# Patient Record
Sex: Female | Born: 1960 | Race: White | Hispanic: No | Smoking: Never smoker
Health system: Southern US, Community
[De-identification: ages and names within clinical notes are randomized; demographics above are authoritative.]

## PROBLEM LIST (undated history)

## (undated) DIAGNOSIS — T7840XA Allergy, unspecified, initial encounter: Secondary | ICD-10-CM

## (undated) DIAGNOSIS — D219 Benign neoplasm of connective and other soft tissue, unspecified: Secondary | ICD-10-CM

## (undated) DIAGNOSIS — F329 Major depressive disorder, single episode, unspecified: Secondary | ICD-10-CM

## (undated) DIAGNOSIS — F32A Depression, unspecified: Secondary | ICD-10-CM

## (undated) DIAGNOSIS — K219 Gastro-esophageal reflux disease without esophagitis: Secondary | ICD-10-CM

## (undated) DIAGNOSIS — N809 Endometriosis, unspecified: Secondary | ICD-10-CM

## (undated) DIAGNOSIS — K635 Polyp of colon: Secondary | ICD-10-CM

## (undated) DIAGNOSIS — M1990 Unspecified osteoarthritis, unspecified site: Secondary | ICD-10-CM

## (undated) DIAGNOSIS — F419 Anxiety disorder, unspecified: Secondary | ICD-10-CM

## (undated) DIAGNOSIS — R3129 Other microscopic hematuria: Secondary | ICD-10-CM

## (undated) DIAGNOSIS — E079 Disorder of thyroid, unspecified: Secondary | ICD-10-CM

## (undated) HISTORY — DX: Unspecified osteoarthritis, unspecified site: M19.90

## (undated) HISTORY — DX: Major depressive disorder, single episode, unspecified: F32.9

## (undated) HISTORY — DX: Other microscopic hematuria: R31.29

## (undated) HISTORY — DX: Endometriosis, unspecified: N80.9

## (undated) HISTORY — DX: Benign neoplasm of connective and other soft tissue, unspecified: D21.9

## (undated) HISTORY — DX: Depression, unspecified: F32.A

## (undated) HISTORY — PX: BREAST SURGERY: SHX581

## (undated) HISTORY — PX: COLONOSCOPY: SHX174

## (undated) HISTORY — DX: Gastro-esophageal reflux disease without esophagitis: K21.9

## (undated) HISTORY — DX: Anxiety disorder, unspecified: F41.9

## (undated) HISTORY — DX: Polyp of colon: K63.5

## (undated) HISTORY — DX: Disorder of thyroid, unspecified: E07.9

## (undated) HISTORY — PX: APPENDECTOMY: SHX54

## (undated) HISTORY — DX: Allergy, unspecified, initial encounter: T78.40XA

---

## 1993-08-27 HISTORY — PX: OTHER SURGICAL HISTORY: SHX169

## 1994-08-27 HISTORY — PX: OOPHORECTOMY: SHX86

## 1994-08-27 HISTORY — PX: SALPINGECTOMY: SHX328

## 2015-03-18 ENCOUNTER — Encounter: Payer: Self-pay | Admitting: Internal Medicine

## 2015-05-24 ENCOUNTER — Encounter: Payer: Self-pay | Admitting: Internal Medicine

## 2015-09-29 ENCOUNTER — Encounter: Payer: Self-pay | Admitting: Internal Medicine

## 2015-09-29 ENCOUNTER — Ambulatory Visit (INDEPENDENT_AMBULATORY_CARE_PROVIDER_SITE_OTHER): Payer: PRIVATE HEALTH INSURANCE | Admitting: Internal Medicine

## 2015-09-29 VITALS — BP 130/84 | HR 83 | Temp 98.1°F | Resp 16 | Ht 64.5 in | Wt 169.0 lb

## 2015-09-29 DIAGNOSIS — H6993 Unspecified Eustachian tube disorder, bilateral: Secondary | ICD-10-CM | POA: Insufficient documentation

## 2015-09-29 DIAGNOSIS — J309 Allergic rhinitis, unspecified: Secondary | ICD-10-CM | POA: Insufficient documentation

## 2015-09-29 DIAGNOSIS — H6981 Other specified disorders of Eustachian tube, right ear: Secondary | ICD-10-CM

## 2015-09-29 DIAGNOSIS — F418 Other specified anxiety disorders: Secondary | ICD-10-CM | POA: Insufficient documentation

## 2015-09-29 DIAGNOSIS — J3089 Other allergic rhinitis: Secondary | ICD-10-CM | POA: Diagnosis not present

## 2015-09-29 DIAGNOSIS — R739 Hyperglycemia, unspecified: Secondary | ICD-10-CM

## 2015-09-29 DIAGNOSIS — E559 Vitamin D deficiency, unspecified: Secondary | ICD-10-CM | POA: Diagnosis not present

## 2015-09-29 DIAGNOSIS — H6983 Other specified disorders of Eustachian tube, bilateral: Secondary | ICD-10-CM | POA: Insufficient documentation

## 2015-09-29 DIAGNOSIS — J329 Chronic sinusitis, unspecified: Secondary | ICD-10-CM | POA: Insufficient documentation

## 2015-09-29 DIAGNOSIS — R7303 Prediabetes: Secondary | ICD-10-CM | POA: Insufficient documentation

## 2015-09-29 MED ORDER — ALPRAZOLAM 0.25 MG PO TABS: 0.2500 mg | ORAL_TABLET | Freq: Every evening | ORAL | Status: DC | PRN

## 2015-09-29 MED ORDER — FLUTICASONE PROPIONATE 50 MCG/ACT NA SUSP: 1.0000 | Freq: Every day | NASAL | Status: DC

## 2015-09-29 MED ORDER — NYSTATIN 100000 UNIT/GM EX CREA: 1.0000 "application " | TOPICAL_CREAM | Freq: Two times a day (BID) | CUTANEOUS | Status: DC

## 2015-09-29 NOTE — Patient Instructions (Addendum)
  We have reviewed your prior records including labs and tests today.  Test(s) ordered today. Your results will be released to [facility] (or called to you) after review, usually within 72hours after test completion. If any changes need to be made, you will be notified at that same time.  All other Health Maintenance issues reviewed.   All recommended immunizations and age-appropriate screenings are up-to-date.  No immunizations administered today.   Medications reviewed and updated.  No changes recommended at this time.  Your prescription(s) have been submitted to your pharmacy. Please take as directed and contact our office if you believe you are having problem(s) with the medication(s).  Follow up as needed

## 2015-09-29 NOTE — Progress Notes (Signed)
Subjective:    Patient ID: Amy Burton, female    DOB: Apr 25, 1961, 55 y.o.   MRN: [id number]  HPI She is here to establish with a new pcp.  She follows with a naturalopath and only takes supplements.  She will take Xanax only as needed for flying.  She needs blood work for her insurance.   Has been diagnosed with RA, fibromyalgia and by changing her lifestyle and has "cured it".  She eats an anti-inflammatory diet. She does experience increased joint pain or other symptoms.  She has not been exercising regularly, but plans on starting. She needs to lose weight and knows what she needs to do. She does eat a very healthy diet, she just needs to restart the exercise.  She does have chronic sinus issues and allergies. She only takes supplements for her medication weight medication if possible. She has used Flonase in the past and wonders about using that as needed.  Recently she has noticed a rash around her mouth. She did start using a different all-natural lip balm and is unsure if that is the cause.  She also tends to occur lips a lot and wonders about possibility of a yeast infection.  Overall she feels well and has no concerns besides those above.  Medications and allergies reviewed with patient and updated if appropriate.  There are no active problems to display for this patient.   No current outpatient prescriptions on file prior to visit.   No current facility-administered medications on file prior to visit.    Past Medical History  Diagnosis Date  . Depression   . Thyroid disease   . Colon polyp     Past Surgical History  Procedure Laterality Date  . Cesarean section    . Breast surgery    . Endomitriosis  1995    Social History   Social History  . Marital Status: Married    Spouse Name: N/A  . Number of Children: N/A  . Years of Education: N/A   Social History Main Topics  . Smoking status: Never Smoker   . Smokeless tobacco: Never Used  . Alcohol  Use: Yes  . Drug Use: No  . Sexual Activity: Not Asked   Other Topics Concern  . None   Social History Narrative  . None    Family History  Problem Relation Age of Onset  . Hyperlipidemia Mother   . Hypertension Mother   . Diabetes Father   . Arthritis Sister   . Heart disease Brother   . Cancer Maternal Grandmother     breast    Review of Systems  Constitutional: Negative for fever and chills.  HENT: Positive for postnasal drip.   Respiratory: Negative for cough, shortness of breath and wheezing.   Cardiovascular: Positive for palpitations (occasional). Negative for chest pain and leg swelling.  Gastrointestinal: Positive for constipation. Negative for nausea and abdominal pain.       No GERD  Genitourinary: Negative for dysuria and hematuria.  Musculoskeletal: Positive for arthralgias (hip pain).  Skin: Positive for rash.  Neurological: Positive for dizziness (ear disease, sinus issures, occasional). Negative for light-headedness and headaches.       Objective:   Filed Vitals:   09/29/15 0852  BP: 130/84  Pulse: 83  Temp: 98.1 F (36.7 C)  Resp: 16   Filed Weights   09/29/15 0852  Weight: 169 lb (76.658 kg)   Body mass index is 28.57 kg/(m^2).   Physical Exam Constitutional: She  appears well-developed and well-nourished. No distress.  HENT:  Head: Normocephalic and atraumatic.  Right Ear: External ear normal. Normal ear canal and TM Left Ear: External ear normal.  Normal ear canal and TM Mouth/Throat: Oropharynx is clear and moist.  Normal bilateral ear canals and tympanic membranes  Eyes: Conjunctivae and EOM are normal.  Neck: Neck supple. No tracheal deviation present. No thyromegaly present.  No carotid bruit  Cardiovascular: Normal rate, regular rhythm and normal heart sounds.   No murmur heard.  No edema. Pulmonary/Chest: Effort normal and breath sounds normal. No respiratory distress. She has no wheezes. She has no rales.  Abdominal: Soft.  She exhibits no distension. There is no tenderness.  Lymphadenopathy: She has no cervical adenopathy.  Skin: Skin is warm and dry. She is not diaphoretic. mild redness, dryness lips, corners of mouth Psychiatric: She has a normal mood and affect. Her behavior is normal.       Assessment & Plan:   Perioral rash Possibly reaction to her new lip balm or yeast infection Nystatin cream prescribed Try switching to a different product Avoid licking lips Do not think she has any vitamin deficiency because she is on multiple vitamins  Routine screening blood work ordered-it has been a while and she really needs it for insurance purposes  Chronic sinus inflammation, allergic allergies and right-sided eustachian tube dysfunction She does see an ENT Can use Flonase as needed  Anxiety with flying Xanax as needed  Mammogram-she likely will have-it has been about 6 years. She typically gets tomography annually  Follow-up as needed

## 2015-09-29 NOTE — Progress Notes (Signed)
Pre visit review using our clinic review tool, if applicable. No additional management support is needed unless otherwise documented below in the visit note. 

## 2015-10-03 ENCOUNTER — Other Ambulatory Visit (INDEPENDENT_AMBULATORY_CARE_PROVIDER_SITE_OTHER): Payer: PRIVATE HEALTH INSURANCE

## 2015-10-03 DIAGNOSIS — E559 Vitamin D deficiency, unspecified: Secondary | ICD-10-CM | POA: Diagnosis not present

## 2015-10-03 DIAGNOSIS — R739 Hyperglycemia, unspecified: Secondary | ICD-10-CM | POA: Diagnosis not present

## 2015-10-03 LAB — LIPID PANEL
CHOL/HDL RATIO: 3
CHOLESTEROL: 211 mg/dL — AB (ref 0–200)
HDL: 66.4 mg/dL (ref >39.00)
LDL Cholesterol: 130 mg/dL — ABNORMAL HIGH (ref 0–99)
NonHDL: 144.84
TRIGLYCERIDES: 76 mg/dL (ref 0.0–149.0)
VLDL: 15.2 mg/dL (ref 0.0–40.0)

## 2015-10-03 LAB — COMPREHENSIVE METABOLIC PANEL
ALBUMIN: 4.6 g/dL (ref 3.5–5.2)
ALK PHOS: 83 U/L (ref 39–117)
ALT: 13 U/L (ref 0–35)
AST: 16 U/L (ref 0–37)
BUN: 18 mg/dL (ref 6–23)
CALCIUM: 10.3 mg/dL (ref 8.4–10.5)
CHLORIDE: 106 meq/L (ref 96–112)
CO2: 26 mEq/L (ref 19–32)
Creatinine, Ser: 0.73 mg/dL (ref 0.40–1.20)
GFR: 87.71 mL/min (ref >60.00)
Glucose, Bld: 97 mg/dL (ref 70–99)
POTASSIUM: 4.1 meq/L (ref 3.5–5.1)
SODIUM: 140 meq/L (ref 135–145)
TOTAL PROTEIN: 7.6 g/dL (ref 6.0–8.3)
Total Bilirubin: 0.5 mg/dL (ref 0.2–1.2)

## 2015-10-03 LAB — CBC WITH DIFFERENTIAL/PLATELET
BASOS PCT: 0.4 % (ref 0.0–3.0)
Basophils Absolute: 0 10*3/uL (ref 0.0–0.1)
EOS ABS: 0.2 10*3/uL (ref 0.0–0.7)
EOS PCT: 2.7 % (ref 0.0–5.0)
HEMATOCRIT: 40.5 % (ref 36.0–46.0)
HEMOGLOBIN: 13.5 g/dL (ref 12.0–15.0)
LYMPHS PCT: 19.2 % (ref 12.0–46.0)
Lymphs Abs: 1.1 10*3/uL (ref 0.7–4.0)
MCHC: 33.3 g/dL (ref 30.0–36.0)
MCV: 84.9 fl (ref 78.0–100.0)
Monocytes Absolute: 0.5 10*3/uL (ref 0.1–1.0)
Monocytes Relative: 7.7 % (ref 3.0–12.0)
Neutro Abs: 4.1 10*3/uL (ref 1.4–7.7)
Neutrophils Relative %: 70 % (ref 43.0–77.0)
Platelets: 266 10*3/uL (ref 150.0–400.0)
RBC: 4.77 Mil/uL (ref 3.87–5.11)
RDW: 13.5 % (ref 11.5–15.5)
WBC: 5.9 10*3/uL (ref 4.0–10.5)

## 2015-10-03 LAB — T3, FREE: T3, Free: 3.3 pg/mL (ref 2.3–4.2)

## 2015-10-03 LAB — T4, FREE: Free T4: 0.72 ng/dL (ref 0.60–1.60)

## 2015-10-03 LAB — TSH: TSH: 2.78 u[IU]/mL (ref 0.35–4.50)

## 2015-10-03 LAB — HEMOGLOBIN A1C: Hgb A1c MFr Bld: 5.9 % (ref 4.6–6.5)

## 2015-10-06 ENCOUNTER — Encounter: Payer: Self-pay | Admitting: Internal Medicine

## 2015-10-06 ENCOUNTER — Telehealth: Payer: Self-pay | Admitting: Internal Medicine

## 2015-10-06 LAB — VITAMIN D 1,25 DIHYDROXY
VITAMIN D 1, 25 (OH) TOTAL: 90 pg/mL — AB (ref 18–72)
Vitamin D2 1, 25 (OH)2: <8 pg/mL
Vitamin D3 1, 25 (OH)2: 90 pg/mL

## 2015-10-06 NOTE — Telephone Encounter (Signed)
Spoke with pt to inform.  

## 2015-10-06 NOTE — Telephone Encounter (Signed)
Pt is requesting lab results from Monday

## 2015-10-06 NOTE — Telephone Encounter (Signed)
Done - released

## 2015-10-06 NOTE — Telephone Encounter (Signed)
Please release labs on MyChart.

## 2015-10-10 ENCOUNTER — Telehealth: Payer: Self-pay | Admitting: Internal Medicine

## 2015-10-10 DIAGNOSIS — Z7689 Persons encountering health services in other specified circumstances: Secondary | ICD-10-CM

## 2015-10-10 NOTE — Telephone Encounter (Signed)
Received email with provider attestation form for CPE. Since taylor and dr burns are both out, filled it out and faxed it in for the patient. Notified, sending to charge and scan

## 2015-10-13 ENCOUNTER — Telehealth: Payer: Self-pay | Admitting: Emergency Medicine

## 2015-10-13 NOTE — Telephone Encounter (Signed)
Pts wellness paperwork has been faxed to the number provided by pt.

## 2015-11-15 ENCOUNTER — Telehealth: Payer: Self-pay | Admitting: Internal Medicine

## 2015-11-15 NOTE — Telephone Encounter (Signed)
What is she taking?  Flonase? Sudafed?

## 2015-11-15 NOTE — Telephone Encounter (Signed)
Patient states she was in last month and Dr. Quay Burow told patient she had fluid on her ear.  Patient states it has gradually gotten worse and now the ear is starting to hurt and keeping her up through the night.  Our offices is full as well as other locations close to her.  She would like to know if Dr. Quay Burow would be able to send something into her pharmacy at [facility] in [place].

## 2015-11-16 NOTE — Telephone Encounter (Signed)
Pt stated that she has been using Flonase and feels that it has made the symptoms decrease. Pt was advised to call back if symptoms failed to improve.

## 2016-01-03 ENCOUNTER — Telehealth: Payer: Self-pay | Admitting: Internal Medicine

## 2016-01-03 ENCOUNTER — Encounter: Payer: Self-pay | Admitting: Internal Medicine

## 2016-01-03 NOTE — Telephone Encounter (Signed)
If it is regarding gyn issues she should see a new gyn for this.  Can try   [facility] - dr Zoe Lan [facility] [address] [place], [place] [address] from [place]  [place] - dr morris [address]  [place], [place]

## 2016-01-03 NOTE — Telephone Encounter (Signed)
Pt is also complaining of hip and lower back pain. She states she is not having lower abdominal pain at the moment. She is not sure if it ortho issues or "female issues" Informed her of the GYN's listed if she would like to get a second opinion. Please advise if she should also go to ortho or see Dr Tamala Julian.

## 2016-01-03 NOTE — Telephone Encounter (Signed)
Please advise if you think pt should try seeing a new GYN.

## 2016-01-03 NOTE — Telephone Encounter (Signed)
Pt states she is having some concerns regarding OBGYN issues. She is thinking everything is kinda falling down and she feels her current Gyn at [facility] is not taking her seriously.  She is wondering if you want to see her about this or if you want to just put a referral in.  She will be unavailable between 2-3 Please advise

## 2016-01-03 NOTE — Telephone Encounter (Signed)
Have her see dr Tamala Julian

## 2016-01-04 NOTE — Telephone Encounter (Signed)
Spoke with pt & Dr Tamala Julian, Pt is scheduled with Dr Tamala Julian on 5/25

## 2016-01-19 ENCOUNTER — Ambulatory Visit: Payer: PRIVATE HEALTH INSURANCE | Admitting: Family Medicine

## 2016-09-06 NOTE — Patient Instructions (Addendum)
Dermatology - Dr Amy Martinique.  [facility].    Test(s) ordered today. Your results will be released to [facility] (or called to you) after review, usually within 72hours after test completion. If any changes need to be made, you will be notified at that same time.  All other Health Maintenance issues reviewed.   All recommended immunizations and age-appropriate screenings are up-to-date or discussed.  No immunizations administered today.   Medications reviewed and updated.  No changes recommended at this time.    Please followup in one year for a physical   Health Maintenance, Female Introduction Adopting a healthy lifestyle and getting preventive care can go a long way to promote health and wellness. Talk with your health care provider about what schedule of regular examinations is right for you. This is a good chance for you to check in with your provider about disease prevention and staying healthy. In between checkups, there are plenty of things you can do on your own. Experts have done a lot of research about which lifestyle changes and preventive measures are most likely to keep you healthy. Ask your health care provider for more information. Weight and diet Eat a healthy diet  Be sure to include plenty of vegetables, fruits, low-fat dairy products, and lean protein.  Do not eat a lot of foods high in solid fats, added sugars, or salt.  Get regular exercise. This is one of the most important things you can do for your health.  Most adults should exercise for at least 150 minutes each week. The exercise should increase your heart rate and make you sweat (moderate-intensity exercise).  Most adults should also do strengthening exercises at least twice a week. This is in addition to the moderate-intensity exercise. Maintain a healthy weight  Body mass index (BMI) is a measurement that can be used to identify possible weight problems. It estimates body fat based on height  and weight. Your health care provider can help determine your BMI and help you achieve or maintain a healthy weight.  For females 15 years of age and older:  A BMI below 18.5 is considered underweight.  A BMI of 18.5 to 24.9 is normal.  A BMI of 25 to 29.9 is considered overweight.  A BMI of 30 and above is considered obese. Watch levels of cholesterol and blood lipids  You should start having your blood tested for lipids and cholesterol at 57 years of age, then have this test every 5 years.  You may need to have your cholesterol levels checked more often if:  Your lipid or cholesterol levels are high.  You are older than 57 years of age.  You are at high risk for heart disease. Cancer screening Lung Cancer  Lung cancer screening is recommended for adults 58-44 years old who are at high risk for lung cancer because of a history of smoking.  A yearly low-dose CT scan of the lungs is recommended for people who:  Currently smoke.  Have quit within the past 15 years.  Have at least a 30-pack-year history of smoking. A pack year is smoking an average of one pack of cigarettes a day for 1 year.  Yearly screening should continue until it has been 15 years since you quit.  Yearly screening should stop if you develop a health problem that would prevent you from having lung cancer treatment. Breast Cancer  Practice breast self-awareness. This means understanding how your breasts normally appear and feel.  It also means doing  regular breast self-exams. Let your health care provider know about any changes, no matter how small.  If you are in your 20s or 30s, you should have a clinical breast exam (CBE) by a health care provider every 1-3 years as part of a regular health exam.  If you are 52 or older, have a CBE every year. Also consider having a breast X-ray (mammogram) every year.  If you have a family history of breast cancer, talk to your health care provider about genetic  screening.  If you are at high risk for breast cancer, talk to your health care provider about having an MRI and a mammogram every year.  Breast cancer gene (BRCA) assessment is recommended for women who have family members with BRCA-related cancers. BRCA-related cancers include:  Breast.  Ovarian.  Tubal.  Peritoneal cancers.  Results of the assessment will determine the need for genetic counseling and BRCA1 and BRCA2 testing. Cervical Cancer  Your health care provider may recommend that you be screened regularly for cancer of the pelvic organs (ovaries, uterus, and vagina). This screening involves a pelvic examination, including checking for microscopic changes to the surface of your cervix (Pap test). You may be encouraged to have this screening done every 3 years, beginning at age 27.  For women ages 80-65, health care providers may recommend pelvic exams and Pap testing every 3 years, or they may recommend the Pap and pelvic exam, combined with testing for human papilloma virus (HPV), every 5 years. Some types of HPV increase your risk of cervical cancer. Testing for HPV may also be done on women of any age with unclear Pap test results.  Other health care providers may not recommend any screening for nonpregnant women who are considered low risk for pelvic cancer and who do not have symptoms. Ask your health care provider if a screening pelvic exam is right for you.  If you have had past treatment for cervical cancer or a condition that could lead to cancer, you need Pap tests and screening for cancer for at least 20 years after your treatment. If Pap tests have been discontinued, your risk factors (such as having a new sexual partner) need to be reassessed to determine if screening should resume. Some women have medical problems that increase the chance of getting cervical cancer. In these cases, your health care provider may recommend more frequent screening and Pap tests. Colorectal  Cancer  This type of cancer can be detected and often prevented.  Routine colorectal cancer screening usually begins at 57 years of age and continues through 57 years of age.  Your health care provider may recommend screening at an earlier age if you have risk factors for colon cancer.  Your health care provider may also recommend using home test kits to check for hidden blood in the stool.  A small camera at the end of a tube can be used to examine your colon directly (sigmoidoscopy or colonoscopy). This is done to check for the earliest forms of colorectal cancer.  Routine screening usually begins at age 67.  Direct examination of the colon should be repeated every 5-10 years through 57 years of age. However, you may need to be screened more often if early forms of precancerous polyps or small growths are found. Skin Cancer  Check your skin from head to toe regularly.  Tell your health care provider about any new moles or changes in moles, especially if there is a change in a mole's shape or  color.  Also tell your health care provider if you have a mole that is larger than the size of a pencil eraser.  Always use sunscreen. Apply sunscreen liberally and repeatedly throughout the day.  Protect yourself by wearing long sleeves, pants, a wide-brimmed hat, and sunglasses whenever you are outside. Heart disease, diabetes, and high blood pressure  High blood pressure causes heart disease and increases the risk of stroke. High blood pressure is more likely to develop in:  People who have blood pressure in the high end of the normal range (130-139/85-89 mm Hg).  People who are overweight or obese.  People who are African American.  If you are 55-48 years of age, have your blood pressure checked every 3-5 years. If you are 25 years of age or older, have your blood pressure checked every year. You should have your blood pressure measured twice-once when you are at a hospital or clinic,  and once when you are not at a hospital or clinic. Record the average of the two measurements. To check your blood pressure when you are not at a hospital or clinic, you can use:  An automated blood pressure machine at a pharmacy.  A home blood pressure monitor.  If you are between 28 years and 67 years old, ask your health care provider if you should take aspirin to prevent strokes.  Have regular diabetes screenings. This involves taking a blood sample to check your fasting blood sugar level.  If you are at a normal weight and have a low risk for diabetes, have this test once every three years after 57 years of age.  If you are overweight and have a high risk for diabetes, consider being tested at a younger age or more often. Preventing infection Hepatitis B  If you have a higher risk for hepatitis B, you should be screened for this virus. You are considered at high risk for hepatitis B if:  You were born in a country where hepatitis B is common. Ask your health care provider which countries are considered high risk.  Your parents were born in a high-risk country, and you have not been immunized against hepatitis B (hepatitis B vaccine).  You have HIV or AIDS.  You use needles to inject street drugs.  You live with someone who has hepatitis B.  You have had sex with someone who has hepatitis B.  You get hemodialysis treatment.  You take certain medicines for conditions, including cancer, organ transplantation, and autoimmune conditions. Hepatitis C  Blood testing is recommended for:  Everyone born from [birth year] through 1966.  Anyone with known risk factors for hepatitis C. Sexually transmitted infections (STIs)  You should be screened for sexually transmitted infections (STIs) including gonorrhea and chlamydia if:  You are sexually active and are younger than 57 years of age.  You are older than 57 years of age and your health care provider tells you that you are at risk  for this type of infection.  Your sexual activity has changed since you were last screened and you are at an increased risk for chlamydia or gonorrhea. Ask your health care provider if you are at risk.  If you do not have HIV, but are at risk, it may be recommended that you take a prescription medicine daily to prevent HIV infection. This is called pre-exposure prophylaxis (PrEP). You are considered at risk if:  You are sexually active and do not regularly use condoms or know the HIV status of your  partner(s).  You take drugs by injection.  You are sexually active with a partner who has HIV. Talk with your health care provider about whether you are at high risk of being infected with HIV. If you choose to begin PrEP, you should first be tested for HIV. You should then be tested every 3 months for as long as you are taking PrEP. Pregnancy  If you are premenopausal and you may become pregnant, ask your health care provider about preconception counseling.  If you may become pregnant, take 400 to 800 micrograms (mcg) of folic acid every day.  If you want to prevent pregnancy, talk to your health care provider about birth control (contraception). Osteoporosis and menopause  Osteoporosis is a disease in which the bones lose minerals and strength with aging. This can result in serious bone fractures. Your risk for osteoporosis can be identified using a bone density scan.  If you are 60 years of age or older, or if you are at risk for osteoporosis and fractures, ask your health care provider if you should be screened.  Ask your health care provider whether you should take a calcium or vitamin D supplement to lower your risk for osteoporosis.  Menopause may have certain physical symptoms and risks.  Hormone replacement therapy may reduce some of these symptoms and risks. Talk to your health care provider about whether hormone replacement therapy is right for you. Follow these instructions at  home:  Schedule regular health, dental, and eye exams.  Stay current with your immunizations.  Do not use any tobacco products including cigarettes, chewing tobacco, or electronic cigarettes.  If you are pregnant, do not drink alcohol.  If you are breastfeeding, limit how much and how often you drink alcohol.  Limit alcohol intake to no more than 1 drink per day for nonpregnant women. One drink equals 12 ounces of beer, 5 ounces of wine, or 1 ounces of hard liquor.  Do not use street drugs.  Do not share needles.  Ask your health care provider for help if you need support or information about quitting drugs.  Tell your health care provider if you often feel depressed.  Tell your health care provider if you have ever been abused or do not feel safe at home. This information is not intended to replace advice given to you by your health care provider. Make sure you discuss any questions you have with your health care provider. Document Released: 02/26/2011 Document Revised: 01/19/2016 Document Reviewed: 05/17/2015  2017 [employer]

## 2016-09-06 NOTE — Progress Notes (Signed)
NSR   Subjective:    Patient ID: Amy Burton, female    DOB: [birth year], 56 y.o.   MRN: [id number]  HPI She is here for a physical exam.   She has had increased stress over the past few months.  It is starting to improve.  Her sleep is getting better.  She does follow with a natural doctor - he thinks some of this is hormonal.  He will be doing a estrogen test.    Medications and allergies reviewed with patient and updated if appropriate.  Patient Active Problem List   Diagnosis Date Noted  . Chronic sinusitis 09/29/2015  . Allergic rhinitis 09/29/2015  . Hyperglycemia 09/29/2015  . Vitamin D deficiency 09/29/2015  . Situational anxiety-flying 09/29/2015  . Eustachian tube dysfunction, right 09/29/2015    Current Outpatient Prescriptions on File Prior to Visit  Medication Sig Dispense Refill  . ALPRAZolam (XANAX) 0.25 MG tablet Take 1 tablet (0.25 mg total) by mouth at bedtime as needed for anxiety (For Dental work and traveling). 30 tablet 0  . ibuprofen (ADVIL,MOTRIN) 600 MG tablet Take 600 mg by mouth every 6 (six) hours as needed.    . Probiotic Product (PROBIOTIC PO) Take by mouth.     No current facility-administered medications on file prior to visit.     Past Medical History:  Diagnosis Date  . Colon polyp   . Depression   . Thyroid disease     Past Surgical History:  Procedure Laterality Date  . BREAST SURGERY    . CESAREAN SECTION    . endomitriosis  1995    Social History   Social History  . Marital status: Married    Spouse name: N/A  . Number of children: N/A  . Years of education: N/A   Social History Main Topics  . Smoking status: Never Smoker  . Smokeless tobacco: Never Used  . Alcohol use Yes  . Drug use: No  . Sexual activity: Not on file   Other Topics Concern  . Not on file   Social History Narrative  . No narrative on file    Family History  Problem Relation Age of Onset  . Hyperlipidemia Mother   . Hypertension Mother    . Diabetes Father   . Arthritis Sister   . Heart disease Brother   . Cancer Maternal Grandmother     breast    Review of Systems  Constitutional: Positive for fever (low grade fevers occaisonally). Negative for chills.  Eyes: Negative for visual disturbance.  Respiratory: Negative for cough, shortness of breath and wheezing.   Cardiovascular: Positive for palpitations (anxiety ). Negative for chest pain and leg swelling.  Gastrointestinal: Positive for abdominal pain (occ LLQ with constipation), anal bleeding (hemorrhoidal) and constipation. Negative for blood in stool, diarrhea and nausea.       Jerrye Bushy - treated with enzymes  Genitourinary: Positive for urgency. Negative for dysuria and hematuria.  Musculoskeletal: Positive for arthralgias (hip).  Skin: Positive for rash (eczema).  Neurological: Negative for light-headedness and headaches.  Psychiatric/Behavioral: Positive for dysphoric mood (mild). The patient is nervous/anxious.        Objective:   Vitals:   09/07/16 0758  BP: 124/80  Pulse: 74  Resp: 16  Temp: 98.5 F (36.9 C)   Filed Weights   09/07/16 0758  Weight: 175 lb (79.4 kg)   Body mass index is 29.12 kg/m.  Wt Readings from Last 3 Encounters:  09/07/16 175 lb (79.4 kg)  09/29/15 169  lb (76.7 kg)     Physical Exam Constitutional: She appears well-developed and well-nourished. No distress.  HENT:  Head: Normocephalic and atraumatic.  Right Ear: External ear normal. Normal ear canal and TM Left Ear: External ear normal.  Normal ear canal and TM Mouth/Throat: Oropharynx is clear and moist.  Eyes: Conjunctivae and EOM are normal.  Neck: Neck supple. No tracheal deviation present. No thyromegaly present.  No carotid bruit  Cardiovascular: Normal rate, regular rhythm and normal heart sounds.   No murmur heard.  No edema. Pulmonary/Chest: Effort normal and breath sounds normal. No respiratory distress. She has no wheezes. She has no rales.  Breast:  deferred to Gyn Abdominal: Soft. She exhibits no distension. There is no tenderness.  Lymphadenopathy: She has no cervical adenopathy.  Skin: Skin is warm and dry. She is not diaphoretic.  Psychiatric: She has a normal mood and affect. Her behavior is normal.         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

## 2016-09-07 ENCOUNTER — Other Ambulatory Visit (INDEPENDENT_AMBULATORY_CARE_PROVIDER_SITE_OTHER): Payer: PRIVATE HEALTH INSURANCE

## 2016-09-07 ENCOUNTER — Ambulatory Visit (INDEPENDENT_AMBULATORY_CARE_PROVIDER_SITE_OTHER): Payer: PRIVATE HEALTH INSURANCE | Admitting: Internal Medicine

## 2016-09-07 ENCOUNTER — Encounter: Payer: Self-pay | Admitting: Internal Medicine

## 2016-09-07 VITALS — BP 124/80 | HR 74 | Temp 98.5°F | Resp 16 | Ht 65.0 in | Wt 175.0 lb

## 2016-09-07 DIAGNOSIS — Z0000 Encounter for general adult medical examination without abnormal findings: Secondary | ICD-10-CM

## 2016-09-07 DIAGNOSIS — H1851 Endothelial corneal dystrophy: Secondary | ICD-10-CM

## 2016-09-07 DIAGNOSIS — R3915 Urgency of urination: Secondary | ICD-10-CM

## 2016-09-07 DIAGNOSIS — L309 Dermatitis, unspecified: Secondary | ICD-10-CM | POA: Insufficient documentation

## 2016-09-07 DIAGNOSIS — R739 Hyperglycemia, unspecified: Secondary | ICD-10-CM | POA: Diagnosis not present

## 2016-09-07 DIAGNOSIS — F418 Other specified anxiety disorders: Secondary | ICD-10-CM

## 2016-09-07 DIAGNOSIS — H18519 Endothelial corneal dystrophy, unspecified eye: Secondary | ICD-10-CM | POA: Insufficient documentation

## 2016-09-07 LAB — CBC WITH DIFFERENTIAL/PLATELET
BASOS PCT: 0.5 % (ref 0.0–3.0)
Basophils Absolute: 0 10*3/uL (ref 0.0–0.1)
EOS PCT: 3.1 % (ref 0.0–5.0)
Eosinophils Absolute: 0.2 10*3/uL (ref 0.0–0.7)
HCT: 38.5 % (ref 36.0–46.0)
Hemoglobin: 13.3 g/dL (ref 12.0–15.0)
Lymphocytes Relative: 20.2 % (ref 12.0–46.0)
Lymphs Abs: 1 10*3/uL (ref 0.7–4.0)
MCHC: 34.5 g/dL (ref 30.0–36.0)
MCV: 83.1 fl (ref 78.0–100.0)
MONOS PCT: 8.5 % (ref 3.0–12.0)
Monocytes Absolute: 0.4 10*3/uL (ref 0.1–1.0)
NEUTROS ABS: 3.3 10*3/uL (ref 1.4–7.7)
NEUTROS PCT: 67.7 % (ref 43.0–77.0)
PLATELETS: 229 10*3/uL (ref 150.0–400.0)
RBC: 4.64 Mil/uL (ref 3.87–5.11)
RDW: 13.9 % (ref 11.5–15.5)
WBC: 4.9 10*3/uL (ref 4.0–10.5)

## 2016-09-07 LAB — HEMOGLOBIN A1C: Hgb A1c MFr Bld: 5.8 % (ref 4.6–6.5)

## 2016-09-07 LAB — TSH: TSH: 1.95 u[IU]/mL (ref 0.35–4.50)

## 2016-09-07 LAB — LIPID PANEL
CHOL/HDL RATIO: 3
CHOLESTEROL: 204 mg/dL — AB (ref 0–200)
HDL: 60.5 mg/dL (ref >39.00)
LDL CALC: 113 mg/dL — AB (ref 0–99)
NonHDL: 143.06
TRIGLYCERIDES: 148 mg/dL (ref 0.0–149.0)
VLDL: 29.6 mg/dL (ref 0.0–40.0)

## 2016-09-07 LAB — COMPREHENSIVE METABOLIC PANEL
ALT: 13 U/L (ref 0–35)
AST: 16 U/L (ref 0–37)
Albumin: 4.5 g/dL (ref 3.5–5.2)
Alkaline Phosphatase: 65 U/L (ref 39–117)
BUN: 15 mg/dL (ref 6–23)
CALCIUM: 10.1 mg/dL (ref 8.4–10.5)
CO2: 27 meq/L (ref 19–32)
Chloride: 107 mEq/L (ref 96–112)
Creatinine, Ser: 0.65 mg/dL (ref 0.40–1.20)
GFR: 99.94 mL/min (ref >60.00)
GLUCOSE: 98 mg/dL (ref 70–99)
POTASSIUM: 3.8 meq/L (ref 3.5–5.1)
Sodium: 141 mEq/L (ref 135–145)
Total Bilirubin: 0.7 mg/dL (ref 0.2–1.2)
Total Protein: 7.1 g/dL (ref 6.0–8.3)

## 2016-09-07 LAB — URINALYSIS, ROUTINE W REFLEX MICROSCOPIC
BILIRUBIN URINE: NEGATIVE
KETONES UR: NEGATIVE
LEUKOCYTES UA: NEGATIVE
Nitrite: NEGATIVE
PH: 6 (ref 5.0–8.0)
RBC / HPF: NONE SEEN (ref 0–?)
Specific Gravity, Urine: <=1.005 — AB (ref 1.000–1.030)
TOTAL PROTEIN, URINE-UPE24: NEGATIVE
URINE GLUCOSE: NEGATIVE
UROBILINOGEN UA: 0.2 (ref 0.0–1.0)

## 2016-09-07 MED ORDER — CETIRIZINE HCL 10 MG PO TABS: 10.0000 mg | ORAL_TABLET | Freq: Every day | ORAL | 11 refills | Status: AC

## 2016-09-07 MED ORDER — ALPRAZOLAM 0.25 MG PO TABS: 0.2500 mg | ORAL_TABLET | Freq: Every day | ORAL | 0 refills | Status: DC | PRN

## 2016-09-07 NOTE — Progress Notes (Signed)
Pre visit review using our clinic review tool, if applicable. No additional management support is needed unless otherwise documented below in the visit note. 

## 2016-09-07 NOTE — Assessment & Plan Note (Signed)
Check a1c Low sugar/carb diet Encouraged regula exercise

## 2016-09-07 NOTE — Assessment & Plan Note (Signed)
Check UA, UCx to rule out infection 

## 2016-09-07 NOTE — Assessment & Plan Note (Signed)
Uses xanax only as needed - flying and dental work Refilled today

## 2016-09-08 ENCOUNTER — Encounter: Payer: Self-pay | Admitting: Internal Medicine

## 2016-09-08 NOTE — Progress Notes (Unsigned)
Amy Burton,  Your blood counts, thyroid function, kidney function and liver tests are normal.  Your cholesterol is good.  Your sugars are still in the prediabetic range, but slightly better than one year ago.  Your urine is normal without infection.    Let me know if you have any questions or concerns.   Dr. Billey Gosling.

## 2016-09-09 LAB — URINE CULTURE: ORGANISM ID, BACTERIA: NO GROWTH

## 2016-09-26 ENCOUNTER — Telehealth: Payer: Self-pay | Admitting: Internal Medicine

## 2016-09-26 NOTE — Telephone Encounter (Signed)
Pt's husband called regarding a form that was filled out earlier in Jan during her CPE, he is stating ins company did not receive it. Per Acquanetta Sit faxed it and sent to scan about a week later. As best as possible please keep an eye out for it to be scanned in to chart and fax to husband at [place], please advise MS

## 2016-10-15 ENCOUNTER — Ambulatory Visit (INDEPENDENT_AMBULATORY_CARE_PROVIDER_SITE_OTHER): Payer: PRIVATE HEALTH INSURANCE | Admitting: Physician Assistant

## 2016-10-15 ENCOUNTER — Encounter: Payer: Self-pay | Admitting: Physician Assistant

## 2016-10-15 ENCOUNTER — Ambulatory Visit (INDEPENDENT_AMBULATORY_CARE_PROVIDER_SITE_OTHER): Payer: PRIVATE HEALTH INSURANCE

## 2016-10-15 VITALS — BP 130/86 | HR 103 | Temp 98.9°F | Ht 65.0 in | Wt 171.4 lb

## 2016-10-15 DIAGNOSIS — R059 Cough, unspecified: Secondary | ICD-10-CM

## 2016-10-15 DIAGNOSIS — R05 Cough: Secondary | ICD-10-CM

## 2016-10-15 DIAGNOSIS — J069 Acute upper respiratory infection, unspecified: Secondary | ICD-10-CM

## 2016-10-15 MED ORDER — HYDROCOD POLST-CPM POLST ER 10-8 MG/5ML PO SUER: 5.0000 mL | Freq: Every evening | ORAL | 0 refills | Status: DC | PRN

## 2016-10-15 MED ORDER — DOXYCYCLINE HYCLATE 100 MG PO TABS
100.0000 mg | ORAL_TABLET | Freq: Two times a day (BID) | ORAL | 0 refills | Status: DC
Start: 2016-10-15 — End: 2017-04-30

## 2016-10-15 NOTE — Progress Notes (Signed)
Pre visit review using our clinic review tool, if applicable. No additional management support is needed unless otherwise documented below in the visit note. 

## 2016-10-15 NOTE — Patient Instructions (Addendum)
It was great meeting you today!  Take the Tussionex as needed for cough.  Increase fluids.  Rest.  Humidifier in bedroom. We will call you with your chest xray results and determine which antibiotic is best for at that time. Call or return to clinic if symptoms are not improving, especially if worsening fever or shortness of breath.   Please go to the ER if you develop any sudden chest pain, shortness of breath, bloody cough, or leg pain.

## 2016-10-15 NOTE — Progress Notes (Signed)
Subjective:    Patient ID: Amy Burton, female    DOB: 05/20/1961, 56 y.o.   MRN: [id number]  HPI  Amy Burton is a 56 y/o female who presents with a 2 month history of intermittent cough with light yellow/clear sputum, intermittent sore throat, nasal and chest congestion. Her husband was diagnosed with the flu at the end of January and she had similar symptoms but tested negative at an urgent care so she just treated it symptomatically. She did not receive a flu vaccine this year. She did not take Tamiflu when her husband had the flu. She is a non-smoker. She has a history of allergic rhinitis and chronic sinusitis. She sees a naturopath and takes several supplements including: Goldenseal Root, Echinacea Root, [place], Olba's Cough Syrup, Olba's Lozenges, Lauricidin (monolaugin Supp), Zyrtec, B12-5000, Bio B Complex, B6 Phosphate. She has recently traveled, she flew back from [place] last week and then had a connecting flight, she also went on a 4 hour long car trip in the afternoon. She denies chest pain, n/v/d, night sweats, hemoptysis, unusual leg pain, swelling/redness/tenderness of calves, personal/family hx of blood clots, hormonal use.  Review of Systems  See HPI  Past Medical History:  Diagnosis Date  . Colon polyp   . Depression   . Thyroid disease      Social History   Social History  . Marital status: Married    Spouse name: N/A  . Number of children: N/A  . Years of education: N/A   Occupational History  . Not on file.   Social History Main Topics  . Smoking status: Never Smoker  . Smokeless tobacco: Never Used  . Alcohol use Yes  . Drug use: No  . Sexual activity: Not on file   Other Topics Concern  . Not on file   Social History Narrative  . No narrative on file    Past Surgical History:  Procedure Laterality Date  . BREAST SURGERY    . CESAREAN SECTION    . endomitriosis  1995    Family History  Problem Relation Age of Onset  .  Hyperlipidemia Mother   . Hypertension Mother   . Diabetes Father   . Arthritis Sister   . Heart disease Brother   . Heart attack Brother   . Cancer Maternal Grandmother     breast    Allergies  Allergen Reactions  . Sulfa Antibiotics     Current Outpatient Prescriptions on File Prior to Visit  Medication Sig Dispense Refill  . ALPRAZolam (XANAX) 0.25 MG tablet Take 1 tablet (0.25 mg total) by mouth daily as needed for anxiety (For Dental work and traveling). 30 tablet 0  . B Complex Vitamins (VITAMIN B COMPLEX PO) Take by mouth.    . cetirizine (ZYRTEC) 10 MG tablet Take 1 tablet (10 mg total) by mouth daily. 30 tablet 11  . Cyanocobalamin (VITAMIN B-12 PO) Take 5,000 mg by mouth daily.     Marland Kitchen ibuprofen (ADVIL,MOTRIN) 600 MG tablet Take 600 mg by mouth every 6 (six) hours as needed.    . Probiotic Product (PROBIOTIC PO) Take by mouth.     No current facility-administered medications on file prior to visit.     BP 130/86 (BP Location: Left Arm, Cuff Size: Normal)   Pulse (!) 103   Temp 98.9 F (37.2 C) (Oral)   Ht 5\' 5"  (1.651 m)   Wt 171 lb 6.1 oz (77.7 kg)   SpO2 96%   BMI 28.52 kg/m  Objective:   Physical Exam  Constitutional: She appears well-developed and well-nourished. She is cooperative.  Non-toxic appearance. She does not have a sickly appearance. She does not appear ill. No distress.  HENT:  Head: Normocephalic and atraumatic.  Right Ear: Tympanic membrane, external ear and ear canal normal. Tympanic membrane is not erythematous, not retracted and not bulging.  Left Ear: Tympanic membrane, external ear and ear canal normal. Tympanic membrane is not erythematous, not retracted and not bulging.  Nose: Right sinus exhibits no maxillary sinus tenderness and no frontal sinus tenderness. Left sinus exhibits maxillary sinus tenderness. Left sinus exhibits no frontal sinus tenderness.  Mouth/Throat: Uvula is midline. Posterior oropharyngeal erythema present. No  oropharyngeal exudate or posterior oropharyngeal edema.  Cardiovascular: Regular rhythm and normal heart sounds.  Tachycardia present.   Pulmonary/Chest: Effort normal. No accessory muscle usage. No respiratory distress. She has decreased breath sounds in the right lower field and the left lower field. She has no wheezes. She has no rhonchi. She has no rales.  Musculoskeletal:  No LE swelling, redness or tenderness. Calves appear symmetric. No pitting edema. Homan's sign negative bilaterally.  Lymphadenopathy:    She has no cervical adenopathy.  Neurological: She is alert.  Nursing note and vitals reviewed.  CXR PA and lateral:  IMPRESSION: No infiltrate or pulmonary edema. Mild perihilar and infrahilar bronchitic changes. Mild degenerative changes thoracic spine.  EKG tracing is personally reviewed.  EKG notes NSR.  No acute changes.      Assessment & Plan:  Upper respiratory tract infection, unspecified type Chest xray and clinical picture consistent with URI, likely bronchitis. Tussionex per orders. Doxycycline per orders. Discussed my recommendation of holding her naturopathic supplements while taking prescription medications to avoid potential interactions, patient verbalized understanding. EKG without acute changes. Low suspicion for pulmonary embolism, however advised patient as follows: Please go to the ER if you develop any sudden chest pain, shortness of breath, bloody cough, or leg pain. Patient verbalized understanding.  Inda Coke PA-C 10/15/16

## 2016-10-16 ENCOUNTER — Telehealth: Payer: Self-pay | Admitting: Physician Assistant

## 2016-10-16 NOTE — Telephone Encounter (Signed)
She may continue Zyrtec. I am not sure what Lafonda Mosses is, I don't have any record of her being on that medication, is it gabapentin? If it is one of her supplements, I do not recommend her taking it because I do not know if it will interact with the medicines we prescribed. The Tussionex has some medicine in that may also help her sleep.

## 2016-10-16 NOTE — Telephone Encounter (Signed)
-  Patient calling about ibuprofen with what Wanda Plump prescribed her yesterday.   -Should patient stay on cetirizine (ZYRTEC) 10 MG tablet?  -Can patient take her [place] with the chlorpheniramine-HYDROcodone (TUSSIONEX PENNKINETIC ER) 10-8 MG/5ML SUER as well. It helps her sleep.   Please call patient back to advise.

## 2016-10-16 NOTE — Telephone Encounter (Signed)
Spoke to pt, told her okay to take Ibuprofen and Zyrtec. Asked pt what Lafonda Mosses was? Pt said one of her natural supplements. Told her Aldona Bar does not recommend you taking it because she does not know if it will interact with the medicine prescribed. Tussionex does have codeine in which may help you sleep. Pt verbalized understanding and said she took Tussinex last night and not her supplement and she did not sleep and needs to sleep. Told pt she should contact physician that prescribe natural supplements and ask if it would interact, because we would not know. Pt verbalized understanding. Samantha notified that I told pt to contact her physician that prescribes natural supplement.

## 2016-10-16 NOTE — Telephone Encounter (Signed)
Samantha, please see message and advise. 

## 2017-02-05 ENCOUNTER — Encounter: Payer: Self-pay | Admitting: Internal Medicine

## 2017-02-05 DIAGNOSIS — M549 Dorsalgia, unspecified: Secondary | ICD-10-CM

## 2017-02-05 DIAGNOSIS — N819 Female genital prolapse, unspecified: Secondary | ICD-10-CM

## 2017-02-05 DIAGNOSIS — M542 Cervicalgia: Secondary | ICD-10-CM

## 2017-02-05 DIAGNOSIS — M25559 Pain in unspecified hip: Secondary | ICD-10-CM

## 2017-04-30 ENCOUNTER — Ambulatory Visit (INDEPENDENT_AMBULATORY_CARE_PROVIDER_SITE_OTHER): Payer: PRIVATE HEALTH INSURANCE | Admitting: Internal Medicine

## 2017-04-30 ENCOUNTER — Encounter: Payer: Self-pay | Admitting: Internal Medicine

## 2017-04-30 VITALS — BP 150/100 | HR 82 | Temp 97.9°F | Resp 16 | Wt 173.0 lb

## 2017-04-30 DIAGNOSIS — G8929 Other chronic pain: Secondary | ICD-10-CM

## 2017-04-30 DIAGNOSIS — H6983 Other specified disorders of Eustachian tube, bilateral: Secondary | ICD-10-CM | POA: Diagnosis not present

## 2017-04-30 DIAGNOSIS — M542 Cervicalgia: Secondary | ICD-10-CM | POA: Diagnosis not present

## 2017-04-30 DIAGNOSIS — M549 Dorsalgia, unspecified: Secondary | ICD-10-CM

## 2017-04-30 DIAGNOSIS — N819 Female genital prolapse, unspecified: Secondary | ICD-10-CM | POA: Diagnosis not present

## 2017-04-30 DIAGNOSIS — M25559 Pain in unspecified hip: Secondary | ICD-10-CM

## 2017-04-30 NOTE — Assessment & Plan Note (Signed)
No evidence of infection Allergy meds, nasal spray prn

## 2017-04-30 NOTE — Assessment & Plan Note (Signed)
Amy Burton

## 2017-04-30 NOTE — Assessment & Plan Note (Signed)
Referred to Dr Tamala Julian for further evaluation and treatment

## 2017-04-30 NOTE — Patient Instructions (Addendum)
[email or web address]  [facility]  [address]  [place], [place] [address]  Main: [phone]    Make an appointment with Dr Faythe Casa

## 2017-04-30 NOTE — Progress Notes (Signed)
Subjective:    Patient ID: Amy Burton, female    DOB: 08/08/1961, 56 y.o.   MRN: [id number]  HPI She is here for an acute visit to discuss referrals.   Back and neck pain, chronic:  She has chronic neck pain in the C4-5 area from an old gymnastics injury.  She has had x-rays years ago.  She has seen a chiropractor in the past, but has not seen anyone else for this.  she is following with a naturalist for overall health and inflammation.  Her back pain is typically in the center lower back, but she has had it in other areas recently.  The pain is worse recently.  She has difficulty standing in one place for more than 20 minutes.  She has not been able to exercise and wants to work on weight loss, which she knows will help.  She is worried about seeing a chiropractor she does not know much about.  She occasional has shooting pain down her right leg - it is a piercing pain.  Right hip pain;  She has been having right hip pain for a while.  She knows she has some arthritis there.  She recently has had some left hip pain.  the right hip is a dull ache.  Usually advil improves it.  The hips hurt with walking and taking steps.  It feels like her hips will give out on her.    She has pelvic prolapse which was diagnosed by her former gyn.  She sees a gyn here but does not feel that he is communicating enough to her about this and feels like she needs to see someone different.  She prefers a female physician.  She did try PT but was not able to do it very long.  She has intermittent ear pain.  She did have a ear infection recently and she would like her ears looked at.  She denies pain now.  She has had ETD for a while.       Medications and allergies reviewed with patient and updated if appropriate.  Patient Active Problem List   Diagnosis Date Noted  . 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 Eczema 09/07/2016  . Urinary urgency 09/07/2016  . Chronic sinusitis 09/29/2015  . Allergic  rhinitis 09/29/2015  . Hyperglycemia 09/29/2015  . Vitamin D deficiency 09/29/2015  . Situational anxiety-flying 09/29/2015  . Eustachian tube dysfunction, right 09/29/2015    Current Outpatient Prescriptions on File Prior to Visit  Medication Sig Dispense Refill  . ALPRAZolam (XANAX) 0.25 MG tablet Take 1 tablet (0.25 mg total) by mouth daily as needed for anxiety (For Dental work and traveling). 30 tablet 0  . B Complex Vitamins (VITAMIN B COMPLEX PO) Take by mouth.    . cetirizine (ZYRTEC) 10 MG tablet Take 1 tablet (10 mg total) by mouth daily. 30 tablet 11  . Cyanocobalamin (VITAMIN B-12 PO) Take 5,000 mg by mouth daily.     Marland Kitchen ibuprofen (ADVIL,MOTRIN) 600 MG tablet Take 600 mg by mouth every 6 (six) hours as needed.    Marland Kitchen OVER THE COUNTER MEDICATION Take 2 tablets by mouth daily.    . Probiotic Product (PROBIOTIC PO) Take by mouth.     No current facility-administered medications on file prior to visit.     Past Medical History:  Diagnosis Date  . Colon polyp   . Depression   . Thyroid disease     Past Surgical History:  Procedure Laterality Date  .  BREAST SURGERY    . CESAREAN SECTION    . endomitriosis  1995    Social History   Social History  . Marital status: Married    Spouse name: N/A  . Number of children: N/A  . Years of education: N/A   Social History Main Topics  . Smoking status: Never Smoker  . Smokeless tobacco: Never Used  . Alcohol use Yes  . Drug use: No  . Sexual activity: Not Asked   Other Topics Concern  . None   Social History Narrative  . None    Family History  Problem Relation Age of Onset  . Hyperlipidemia Mother   . Hypertension Mother   . Diabetes Father   . Arthritis Sister   . Heart disease Brother   . Heart attack Brother   . Cancer Maternal Grandmother        breast    Review of Systems  Constitutional: Negative for fever.  HENT: Positive for ear pain (intermittent).   Musculoskeletal: Positive for arthralgias,  back pain and neck pain.       Objective:   Vitals:   04/30/17 1013  BP: (!) 150/100  Pulse: 82  Resp: 16  Temp: 97.9 F (36.6 C)  SpO2: 98%   Filed Weights   04/30/17 1013  Weight: 173 lb (78.5 kg)   Body mass index is 28.79 kg/m.  Wt Readings from Last 3 Encounters:  04/30/17 173 lb (78.5 kg)  10/15/16 171 lb 6.1 oz (77.7 kg)  09/07/16 175 lb (79.4 kg)     Physical Exam  Constitutional: She appears well-developed and well-nourished. No distress.  HENT:  Head: Normocephalic and atraumatic.  Right Ear: External ear normal.  Left Ear: External ear normal.  Mouth/Throat: Oropharynx is clear and moist.  B/l ear canals and TM normal  Musculoskeletal: She exhibits no edema.  Skin: Skin is warm and dry. She is not diaphoretic.          Assessment & Plan:   See Problem List for Assessment and Plan of chronic medical problems.

## 2017-05-14 NOTE — Progress Notes (Signed)
[facility] [address] [place], [place] [address] Phone: [phone] Subjective:    I'm seeing this patient by the request  of:  Binnie Rail, MD   CC: Back and hip pain, neck pain  [id number]  Amy Burton is a 56 y.o. female coming in with complaint of back and hip pain. Asian has also had chronic neck pain. She has had pain for the past 6 months. Her pain does radiate down each leg. She feels a locked sensation. She has been to the chiropractor for the issue. 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 She does have a firm mattress and is using a rub that has CBD oil in it. She notices a big difference when using this.   Onset- chronic  Location- entire spine Duration- constant  Character-stabbing, achy Aggravating factors- unpredictable Reliving factors-  Therapies tried- Patient did see and antibiotic long ago but patient's now integrated medicine doctor does not want him to see anyone. 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  Past Medical History:  Diagnosis Date  . Colon polyp   . Depression   . Thyroid disease    Past Surgical History:  Procedure Laterality Date  . BREAST SURGERY    . CESAREAN SECTION    . endomitriosis  1995   Social History   Social History  . Marital status: Married    Spouse name: N/A  . Number of children: N/A  . Years of education: N/A   Social History Main Topics  . Smoking status: Never Smoker  . Smokeless tobacco: Never Used  . Alcohol use Yes  . Drug use: No  . Sexual activity: Not on file   Other Topics Concern  . Not on file   Social History Narrative  . No narrative on file   Allergies  Allergen Reactions  . Sulfa Antibiotics    Family History  Problem  Relation Age of Onset  . Hyperlipidemia Mother   . Hypertension Mother   . Diabetes Father   . Arthritis Sister   . Heart disease Brother   . Heart attack Brother   . Cancer Maternal Grandmother        breast     Past medical history, social, surgical and family history all reviewed in electronic medical record.  No pertanent information unless stated regarding to the chief complaint.   Review of Systems:Review of systems updated and as accurate as of 05/14/17  No headache, visual changes, nausea, vomiting, diarrhea, constipation, dizziness, abdominal pain, skin rash, fevers, chills, night sweats, weight loss, swollen lymph nodes, body aches, joint swelling, muscle aches, chest pain, shortness of breath, mood changes.   Objective  There were no vitals taken for this visit. Systems examined below as of 05/14/17   General: No apparent distress alert and oriented x3 mood and affect normal, dressed appropriately.  HEENT: Pupils equal, extraocular movements intact  Respiratory: Patient's speak in full sentences and does not appear short of breath  Cardiovascular: No lower extremity edema, non tender, no erythema  Skin: Warm dry intact with no signs of infection or rash on extremities or on axial skeleton.  Abdomen: Soft nontender  Neuro: Cranial nerves II through XII are intact, neurovascularly intact in all extremities with  2+ DTRs and 2+ pulses.  Lymph: No lymphadenopathy of posterior or anterior cervical chain or axillae bilaterally.  Gait normal with good balance and coordination.  MSK:  Non tender with full range of motion and good stability and symmetric strength and tone of shoulders, elbows, wrist, hip, knee and ankles bilaterally.  Neck: Inspection unremarkable. No palpable stepoffs. Negative Spurling's maneuver. Full neck range of motion Grip strength and sensation normal in bilateral hands Strength good C4 to T1 distribution No sensory change to C4 to T1 Negative Hoffman  sign bilaterally Reflexes normal Significantly large breast poor posture  Back Exam:  Inspection: Mild loss of lordosis Motion: Flexion 45 deg, Extension 25 deg, Side Bending to 45 deg bilaterally,  Rotation to 45 deg bilaterally  SLR laying: Negative  XSLR laying: Negative  Palpable tenderness: Tender to palpation of the paraspinal musculature of the lumbar spine and mostly in the thoracolumbar juncture.Marland Kitchen FABER: Tightness bilaterally. Sensory change: Gross sensation intact to all lumbar and sacral dermatomes.  Reflexes: 2+ at both patellar tendons, 2+ at achilles tendons, Babinski's downgoing.  Strength at foot  Plantar-flexion: 5/5 Dorsi-flexion: 5/5 Eversion: 5/5 Inversion: 5/5  Leg strength  Quad: 5/5 Hamstring: 5/5 Hip flexor: 5/5 Hip abductors: 4/5  Gait unremarkable.  Osteopathic findings C2 flexed rotated and side bent right C7 flexed rotated and side bent left T3 extended rotated and side bent left inhaled third rib T9 extended rotated and side bent left L3 flexed rotated and side bent right Sacrum right on right    Impression and Recommendations:     This case required medical decision making of moderate complexity.      Note: This dictation was prepared with Dragon dictation along with smaller phrase technology. Any transcriptional errors that result from this process are unintentional.

## 2017-05-15 ENCOUNTER — Ambulatory Visit (INDEPENDENT_AMBULATORY_CARE_PROVIDER_SITE_OTHER)
Admission: RE | Admit: 2017-05-15 | Discharge: 2017-05-15 | Disposition: A | Discharge status: Home / Self Care | Payer: PRIVATE HEALTH INSURANCE | Source: Ambulatory Visit | Attending: Family Medicine | Admitting: Family Medicine

## 2017-05-15 ENCOUNTER — Ambulatory Visit (INDEPENDENT_AMBULATORY_CARE_PROVIDER_SITE_OTHER): Payer: PRIVATE HEALTH INSURANCE | Admitting: Family Medicine

## 2017-05-15 ENCOUNTER — Encounter: Payer: Self-pay | Admitting: Family Medicine

## 2017-05-15 VITALS — BP 128/98 | HR 92 | Ht 64.0 in | Wt 173.0 lb

## 2017-05-15 DIAGNOSIS — M542 Cervicalgia: Secondary | ICD-10-CM | POA: Diagnosis not present

## 2017-05-15 DIAGNOSIS — M545 Low back pain: Secondary | ICD-10-CM

## 2017-05-15 DIAGNOSIS — M999 Biomechanical lesion, unspecified: Secondary | ICD-10-CM

## 2017-05-15 DIAGNOSIS — G8929 Other chronic pain: Secondary | ICD-10-CM | POA: Diagnosis not present

## 2017-05-15 NOTE — Assessment & Plan Note (Signed)
Decision today to treat with OMT was based on Physical Exam  After verbal consent patient was treated with HVLA, ME, FPR techniques in cervical, thoracic, rib lumbar and sacral areas  Patient tolerated the procedure well with improvement in symptoms  Patient given exercises, stretches and lifestyle modifications  See medications in patient instructions if given  Patient will follow up in 4 weeks 

## 2017-05-15 NOTE — Patient Instructions (Addendum)
Good to see you.  Xray downstairs Ice 20 minutes 2 times daily. Usually after activity and before bed. Exercises 3 times a week.  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  Turmeric 500mg  twice daily  Tart cherry extract 1200mg  at night Duexis 3 times a day for 3 days to decrease inflammation  pennsaid pinkie amount topically 2 times daily as needed.   See me again I n4 weeks.

## 2017-05-15 NOTE — Assessment & Plan Note (Signed)
I do believe the patient back pain is mostly musculoskeletal in nature. I do think that this poor posture and ergonomics. We discussed home exercises and patient work with Product/process development scientist. X-rays ordered today to rule out any bony normality that could be contribute in. We discussed which activities doing which ones to avoid. Patient is, try some other medications over-the-counter. Patient will come back and see me again in 4 weeks

## 2017-06-05 ENCOUNTER — Encounter: Payer: Self-pay | Admitting: Internal Medicine

## 2017-06-10 ENCOUNTER — Ambulatory Visit: Payer: PRIVATE HEALTH INSURANCE | Admitting: Family Medicine

## 2017-06-12 ENCOUNTER — Ambulatory Visit (INDEPENDENT_AMBULATORY_CARE_PROVIDER_SITE_OTHER): Payer: PRIVATE HEALTH INSURANCE | Admitting: Family Medicine

## 2017-06-12 ENCOUNTER — Encounter: Payer: Self-pay | Admitting: Family Medicine

## 2017-06-12 VITALS — BP 120/86 | HR 73 | Ht 64.5 in | Wt 172.0 lb

## 2017-06-12 DIAGNOSIS — M6289 Other specified disorders of muscle: Secondary | ICD-10-CM | POA: Diagnosis not present

## 2017-06-12 DIAGNOSIS — G8929 Other chronic pain: Secondary | ICD-10-CM

## 2017-06-12 DIAGNOSIS — M999 Biomechanical lesion, unspecified: Secondary | ICD-10-CM

## 2017-06-12 DIAGNOSIS — N819 Female genital prolapse, unspecified: Secondary | ICD-10-CM

## 2017-06-12 DIAGNOSIS — M542 Cervicalgia: Secondary | ICD-10-CM | POA: Diagnosis not present

## 2017-06-12 NOTE — Assessment & Plan Note (Signed)
Decision today to treat with OMT was based on Physical Exam  After verbal consent patient was treated with HVLA, ME, FPR techniques in cervical, thoracic, lumbar and sacral areas  Patient tolerated the procedure well with improvement in symptoms  Patient given exercises, stretches and lifestyle modifications  See medications in patient instructions if given  Patient will follow up in 4-6 weeks 

## 2017-06-12 NOTE — Assessment & Plan Note (Signed)
Has done fairly well with posture and ergonomics. Continue the same regimen at this time. Has been doing well with formal physical therapy review sleep as well. I do think the patient will continue to improve and will see me again in 4-6 weeks.

## 2017-06-12 NOTE — Patient Instructions (Signed)
Good to see you  You are doing well overall  We will get yo uin with physical therpy and they will call you  Keep it up otherwise you are making great progress.  See me again in 4-5 weeks.

## 2017-06-12 NOTE — Assessment & Plan Note (Signed)
Patient feels that she does have more from pelvic prolapse. I think it is more pelvic floor dysfunction. Sent to physical therapy for the specialist. I think it could make a significant improvement. We discussed icing regimen, home exercises, which activities to do an which was to avoid. Patient will start to come back and see me again in 4-6 weeks.

## 2017-06-12 NOTE — Progress Notes (Signed)
[facility] [place], [place] [address] Phone: [phone] Subjective:     CC: Neck and back pain follow-up  DUK:[id number]  Amy Burton is a 56 y.o. female coming in for follow up for cervical and back pain. She has been working on her posture which she said seems to be helping. She had a headache after the cervical spine adjustment but overall the other adjustments improved her pain. Patient feels like his long as she works on her posture she seems to be making some improvement.  Patient is also having some pelvic floor difficulty. Patient has intermittent incontinence. Patient has seen multiple providers in just to her that that is not likely contribute intact her at the moment. Patient feels that she had the right exercises for that she can make some significant improvement. Patient denies any significant back pain associated with it. Denies any fevers chills or any abnormal weight loss.       Past Medical History:  Diagnosis Date  . Colon polyp   . Depression   . Thyroid disease    Past Surgical History:  Procedure Laterality Date  . BREAST SURGERY    . CESAREAN SECTION    . endomitriosis  1995   Social History   Social History  . Marital status: Married    Spouse name: N/A  . Number of children: N/A  . Years of education: N/A   Social History Main Topics  . Smoking status: Never Smoker  . Smokeless tobacco: Never Used  . Alcohol use Yes  . Drug use: No  . Sexual activity: Not on file   Other Topics Concern  . Not on file   Social History Narrative  . No narrative on file   Allergies  Allergen Reactions  . Sulfa Antibiotics    Family History  Problem Relation Age of Onset  . Hyperlipidemia Mother   . Hypertension Mother   . Diabetes Father   . Arthritis Sister   . Heart disease Brother   . Heart attack Brother   . Cancer Maternal Grandmother        breast     Past medical history, social,  surgical and family history all reviewed in electronic medical record.  No pertanent information unless stated regarding to the chief complaint.   Review of Systems:Review of systems updated and as accurate as of 06/12/17  No headache, visual changes, nausea, vomiting, diarrhea, constipation, dizziness, abdominal pain, skin rash, fevers, chills, night sweats, weight loss, swollen lymph nodes, body aches, joint swelling, , chest pain, shortness of breath, mood changes. Positive muscle aches urinary difficulties  Objective  There were no vitals taken for this visit. Systems examined below as of 06/12/17   General: No apparent distress alert and oriented x3 mood and affect normal, dressed appropriately.  HEENT: Pupils equal, extraocular movements intact  Respiratory: Patient's speak in full sentences and does not appear short of breath  Cardiovascular: No lower extremity edema, non tender, no erythema  Skin: Warm dry intact with no signs of infection or rash on extremities or on axial skeleton.  Abdomen: Soft nontender  Neuro: Cranial nerves II through XII are intact, neurovascularly intact in all extremities with 2+ DTRs and 2+ pulses.  Lymph: No lymphadenopathy of posterior or anterior cervical chain or axillae bilaterally.  Gait normal with good balance and coordination.  MSK:  Non tender with full range of motion and good stability and symmetric strength and tone of shoulders, elbows,  wrist, hip, knee and ankles bilaterally.  Neck: Inspection unremarkable. No palpable stepoffs. Negative Spurling's maneuver. Full neck range of motion Grip strength and sensation normal in bilateral hands Strength good C4 to T1 distribution No sensory change to C4 to T1 Negative Hoffman sign bilaterally Reflexes normal Continues to have some mild poor posture. States some mild scapular dyskinesis noted.  Patient's pelvic floor does have some mild weakness especially of the operators  bilaterally  Osteopathic findings Cervical C2 flexed rotated and side bent right C4 flexed rotated and side bent left C6 flexed rotated and side bent right  T3 extended rotated and side bent right inhaled third rib T9 extended rotated and side bent left L2 flexed rotated and side bent right Sacrum right on right    Impression and Recommendations:     This case required medical decision making of moderate complexity.      Note: This dictation was prepared with Dragon dictation along with smaller phrase technology. Any transcriptional errors that result from this process are unintentional.

## 2017-06-19 ENCOUNTER — Ambulatory Visit: Payer: PRIVATE HEALTH INSURANCE | Admitting: Physical Therapy

## 2017-06-20 ENCOUNTER — Ambulatory Visit: Payer: 59 | Attending: Family Medicine | Admitting: Physical Therapy

## 2017-06-20 ENCOUNTER — Encounter: Payer: Self-pay | Admitting: Physical Therapy

## 2017-06-20 DIAGNOSIS — R252 Cramp and spasm: Secondary | ICD-10-CM | POA: Insufficient documentation

## 2017-06-20 DIAGNOSIS — M6281 Muscle weakness (generalized): Secondary | ICD-10-CM

## 2017-06-20 NOTE — Therapy (Signed)
[address] [facility] [address], [place], [place], [address] Phone: [phone]   Fax:  [phone]  Physical Therapy Evaluation  Patient Details  Name: Amy Burton MRN: [id number] Date of Birth: [birth year] Referring Provider: Dr. Lyndal Pulley  Encounter Date: 06/20/2017      PT End of Session - 06/20/17 1310    Visit Number 1   Date for PT Re-Evaluation 09/19/17   Authorization Type [employer]   Authorization - Visit Number 1   Authorization - Number of Visits 30   PT Start Time 1324   PT Stop Time 1100   PT Time Calculation (min) 45 min   Activity Tolerance Patient tolerated treatment well   Behavior During Therapy [facility] for tasks assessed/performed      Past Medical History:  Diagnosis Date  . Colon polyp   . Depression   . Thyroid disease     Past Surgical History:  Procedure Laterality Date  . BREAST SURGERY    . CESAREAN SECTION    . endomitriosis  1995    There were no vitals filed for this visit.       Subjective Assessment - 06/20/17 1021    Subjective Patient reports trouble with constipation.  Patient will place finger into the vagina to have a bowel movement when she is constipated. No urinary leakage.  Patient has pelvic pressure.  Patient at times will feel like things are falling out. Patient had endomietriosis and surgery in the past.    Patient Stated Goals reduce pain and improve strength   Currently in Pain? Yes   Pain Score 3   worst 5/10   Pain Location Vagina   Pain Orientation Mid   Pain Type Chronic pain   Pain Onset More than a month ago   Pain Frequency Intermittent   Aggravating Factors  not sure   Pain Relieving Factors lay on back with feet elevated   Multiple Pain Sites No            [facility] PT Assessment - 06/20/17 0001      Assessment   Medical Diagnosis M62.89 Pelvic floor dysfunction   Referring Provider Dr. Lyndal Pulley   Prior Therapy yes several  session     Precautions   Precautions None     Restrictions   Weight Bearing Restrictions No     Balance Screen   Has the patient fallen in the past 6 months No   Has the patient had a decrease in activity level because of a fear of falling?  No   Is the patient reluctant to leave their home because of a fear of falling?  No     Home Ecologist residence     Prior Function   Level of Independence Independent   Leisure standing to paint; sewing;      Cognition   Overall Cognitive Status Within Functional Limits for tasks assessed     Observation/Other Assessments   Skin Integrity c-section has decreased mobility     Posture/Postural Control   Posture/Postural Control No significant limitations     ROM / Strength   AROM / PROM / Strength AROM;PROM;Strength     Strength   Overall Strength Comments lower abdominals unable to contract with bracing   Right Hip External Rotation  5/5   Right Hip Internal Rotation 4/5   Left Hip External Rotation 4/5   Left Hip Internal Rotation 4/5   Left Hip  ABduction 4+/5     Palpation   SI assessment  right ilium rotated anteriorly   Palpation comment tenderness located on right psoas muscle, right ishical tuberosity     Special Tests    Special Tests Sacrolliac Tests   Hip Special Tests  [employer] test   Findings Positive   Side  Right   Comments pain     Trendelenburg Test   Findings Positive   Side Left   Comments right hip drops     Transfers   Transfers Not assessed     Ambulation/Gait   Ambulation/Gait No            Objective measurements completed on examination: See above findings.        Pelvic Floor Special Questions - 06/20/17 0001    Prior Pregnancies Yes   Number of Pregnancies 3   Number of C-Sections 1   Number of Vaginal Deliveries 2   Any difficulty with labor and deliveries Yes  scar in vagina from third child has dropped down   Currently  Sexually Active Yes   Is this Painful Yes  occasionally   Marinoff Scale discomfort that does not affect completion  deep pain ;depend on position   Urinary Leakage No   Urinary frequency some days has to go every 10 minutes and will feel like things are falling out those days   Prolapse Posterior Wall   Pelvic Floor Internal Exam patient confims identification and approves PT to assess pelvic floor muscle integrity   Exam Type Vaginal   Palpation tightness located on bil. urethra sphincter; tenderness located in bil. obturator internist, coccygeus, ischiococcygeus, left ATL   Strength good squeeze, good lift, able to hold agaisnt strong resistance   Tone increased                  PT Education - 06/20/17 1543    Education provided Yes   Education Details lower abdominal contaction in [employer]) Educated Patient   Methods Explanation;Demonstration;Verbal cues;Handout   Comprehension Returned demonstration;Verbalized understanding          PT Short Term Goals - 06/20/17 1544      PT SHORT TERM GOAL #1   Title independent with initial HEP with flexibility exercises   Time 4   Period Weeks   Status New   Target Date 07/18/17     PT SHORT TERM GOAL #2   Title understand correct toileting technique to reduce strain on the pelvic floor   Time 4   Period Weeks   Status New   Target Date 07/18/17     PT SHORT TERM GOAL #3   Title ability to contract lower abdominals to begin core stabilization   Time 4   Period Weeks   Status New   Target Date 07/18/17     PT SHORT TERM GOAL #4   Title understand how to perform abdominal massage to promote improved bowel health and decrease strain on the pelvic floor   Time 4   Period Weeks   Status New   Target Date 07/18/17           PT Long Term Goals - 06/20/17 1547      PT LONG TERM GOAL #1   Title independent with HEP and understand how to progress herself   Time 12   Period Weeks   Status New    Target Date 09/12/17     PT  LONG TERM GOAL #2   Title falling out feeling in the vaginal areal decreased >/= 50% due to improve tone of abdominals and pelvic floor   Time 12   Period Weeks   Status New   Target Date 09/12/17     PT LONG TERM GOAL #3   Title pain in the vaginal area decreased >/= 75% due to improved tissue mobility and tenderness in the vaginal canal   Time 12   Period Weeks   Status New   Target Date 09/12/17     PT LONG TERM GOAL #4   Title straining with a bowel movement decreased >/= 50% due to improved bowel health and coordination of the pelvic floor muscles   Time 12   Status New   Target Date 09/12/17                Plan - 06/20/17 1311    Clinical Impression Statement Patient is a 56 year old female with vaginal pain that has been chronic for several years.  Patient has a history of endometriosis with laproscopic surgery. Patient reports pain is intermittent at level 3/10 and worse at 5/10.  Patient pain will make her have to lay on her back with her feet up.  Patient reports she will have a falling out feeling in the vagina when her pain is severe.  Patient reports constipation and will have to use the spling method to evacuate her bowels.  Pelvic floor strength is 4/5.  Patient has tightness in the urethra sphincter and tenderness located throughout the pelvic floor muscles. Patient has weakness in the posterior vaginal wall with a observable rectocele at the  introitus.  Patient has difficulty with contracting the lower abdominals.  C-section scar is tight with decreased mobility. Right ilium is lower than the left.  Tenderness located on the right psoas. Patient has weakness in bilateral rotators and hip abductors. Patient will benefit from skilled therapy to improve muscle strength and coordination and reduce pain.    History and Personal Factors relevant to plan of care: s/p laproscopic surgery for endometriosis   Clinical Presentation Stable    Clinical Presentation due to: stable condition   Clinical Decision Making Low   Rehab Potential Excellent   Clinical Impairments Affecting Rehab Potential s/p laproscopic surgery for endometriosis   PT Frequency 1x / week   PT Duration 12 weeks   PT Treatment/Interventions Biofeedback;Cryotherapy;Electrical Stimulation;Moist Heat;Ultrasound;Patient/family education;Neuromuscular re-education;Therapeutic exercise;Therapeutic activities;Manual techniques;Dry needling;Scar mobilization   PT Next Visit Plan internal soft tissue work; modalities for pain; scar massage; abdominal massage; toileting technique   PT Home Exercise Plan progress as needed   Consulted and Agree with Plan of Care Patient      Patient will benefit from skilled therapeutic intervention in order to improve the following deficits and impairments:  Pain, Decreased strength, Decreased activity tolerance, Increased fascial restricitons, Increased muscle spasms, Decreased scar mobility  Visit Diagnosis: Muscle weakness (generalized) - Plan: PT plan of care cert/re-cert  Cramp and spasm - Plan: PT plan of care cert/re-cert     Problem List Patient Active Problem List   Diagnosis Date Noted  . Nonallopathic lesion of thoracic region 05/15/2017  . Nonallopathic lesion of cervical region 05/15/2017  . Nonallopathic lesion of sacral region 05/15/2017  . Chronic neck pain 04/30/2017  . Chronic back pain 04/30/2017  . Arthralgia of hip 04/30/2017  . Pelvic prolapse 04/30/2017  . 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 Eczema 09/07/2016  . Urinary urgency 09/07/2016  .  Chronic sinusitis 09/29/2015  . Allergic rhinitis 09/29/2015  . Hyperglycemia 09/29/2015  . Vitamin D deficiency 09/29/2015  . Situational anxiety-flying 09/29/2015  . ETD (Eustachian tube dysfunction), bilateral 09/29/2015    Earlie Counts, PT 06/20/17 3:57 PM   [facility] [address] [place], [place], [address] Phone: [phone]   Fax:  [phone]  Name: Keina Mutch MRN: [id number] Date of Birth: 1961/07/18

## 2017-06-20 NOTE — Patient Instructions (Addendum)
Isometric Hold (Hook-Lying)    Lie with hips and knees bent. Slowly inhale, and then exhale. Pull navel toward spine and Hold for _5__ seconds. Continue to breathe in and out during hold. Rest for _5__ seconds. Repeat __10_ times. Do _2__ times a day.   Copyright  VHI. All rights reserved.   [facility] [address], [place], [place] [address] Phone # [phone] Fax [phone]

## 2017-06-27 ENCOUNTER — Ambulatory Visit: Payer: 59 | Attending: Family Medicine | Admitting: Physical Therapy

## 2017-06-27 ENCOUNTER — Encounter: Payer: Self-pay | Admitting: Physical Therapy

## 2017-06-27 DIAGNOSIS — R252 Cramp and spasm: Secondary | ICD-10-CM | POA: Diagnosis present

## 2017-06-27 DIAGNOSIS — M6281 Muscle weakness (generalized): Secondary | ICD-10-CM | POA: Insufficient documentation

## 2017-06-27 NOTE — Patient Instructions (Signed)
Toileting Techniques for Bowel Movements (Defecation) Using your belly (abdomen) and pelvic floor muscles to have a bowel movement is usually instinctive.  Sometimes people can have problems with these muscles and have to relearn proper defecation (emptying) techniques.  If you have weakness in your muscles, organs that are falling out, decreased sensation in your pelvis, or ignore your urge to go, you may find yourself straining to have a bowel movement.  You are straining if you are: . holding your breath or taking in a huge gulp of air and holding it  . keeping your lips and jaw tensed and closed tightly . turning red in the face because of excessive pushing or forcing . developing or worsening your  hemorrhoids . getting faint while pushing . not emptying completely and have to defecate many times a day  If you are straining, you are actually making it harder for yourself to have a bowel movement.  Many people find they are pulling up with the pelvic floor muscles and closing off instead of opening the anus. Due to lack pelvic floor relaxation and coordination the abdominal muscles, one has to work harder to push the feces out.  Many people have never been taught how to defecate efficiently and effectively.  Notice what happens to your body when you are having a bowel movement.  While you are sitting on the toilet pay attention to the following areas: . Jaw and mouth position . Angle of your hips   . Whether your feet touch the ground or not . Arm placement  . Spine position . Waist . Belly tension . Anus (opening of the anal canal)  An Evacuation/Defecation Plan   Here are the 4 basic points:  1. Lean forward enough for your elbows to rest on your knees 2. Support your feet on the floor or use a low stool if your feet don't touch the floor  3. Push out your belly as if you have swallowed a beach ball-you should feel a widening of your waist 4. Open and relax your pelvic floor muscles,  rather than tightening around the anus      The following conditions my require modifications to your toileting posture:  . If you have had surgery in the past that limits your back, hip, pelvic, knee or ankle flexibility . Constipation   Your healthcare practitioner may make the following additional suggestions and adjustments:  1) Sit on the toilet  a) Make sure your feet are supported. b) Notice your hip angle and spine position-most people find it effective to lean forward or raise their knees, which can help the muscles around the anus to relax  c) When you lean forward, place your forearms on your thighs for support  2) Relax suggestions a) Breath deeply in through your nose and out slowly through your mouth as if you are smelling the flowers and blowing out the candles. b) To become aware of how to relax your muscles, contracting and releasing muscles can be helpful.  Pull your pelvic floor muscles in tightly by using the image of holding back gas, or closing around the anus (visualize making a circle smaller) and lifting the anus up and in.  Then release the muscles and your anus should drop down and feel open. Repeat 5 times ending with the feeling of relaxation. c) Keep your pelvic floor muscles relaxed; let your belly bulge out. d) The digestive tract starts at the mouth and ends at the anal opening, so be   sure to relax both ends of the tube.  Place your tongue on the roof of your mouth with your teeth separated.  This helps relax your mouth and will help to relax the anus at the same time.  3) Empty (defecation) a) Keep your pelvic floor and sphincter relaxed, then bulge your anal muscles.  Make the anal opening wide.  b) Stick your belly out as if you have swallowed a beach ball. c) Make your belly wall hard using your belly muscles while continuing to breathe. Doing this makes it easier to open your anus. d) Breath out and give a grunt (or try using other sounds such as  ahhhh, shhhhh, ohhhh or grrrrrrr).  4) Finish a) As you finish your bowel movement, pull the pelvic floor muscles up and in.  This will leave your anus in the proper place rather than remaining pushed out and down. If you leave your anus pushed out and down, it will start to feel as though that is normal and give you incorrect signals about needing to have a bowel movement.      Toilet Meditation PhysioYoga   Shelly Prosko You tube.   About Abdominal Massage  Abdominal massage, also called external colon massage, is a self-treatment circular massage technique that can reduce and eliminate gas and ease constipation. The colon naturally contracts in waves in a clockwise direction starting from inside the right hip, moving up toward the ribs, across the belly, and down inside the left hip.  When you perform circular abdominal massage, you help stimulate your colon's normal wave pattern of movement called peristalsis.  It is most beneficial when done after eating.  Positioning You can practice abdominal massage with oil while lying down, or in the shower with soap.  Some people find that it is just as effective to do the massage through clothing while sitting or standing.  How to Massage Start by placing your finger tips or knuckles on your right side, just inside your hip bone.  . Make small circular movements while you move upward toward your rib cage.   . Once you reach the bottom right side of your rib cage, take your circular movements across to the left side of the bottom of your rib cage.  . Next, move downward until you reach the inside of your left hip bone.  This is the path your feces travel in your colon. . Continue to perform your abdominal massage in this pattern for 10 minutes each day.     You can apply as much pressure as is comfortable in your massage.  Start gently and build pressure as you continue to practice.  Notice any areas of pain as you massage; areas of slight  pain may be relieved as you massage, but if you have areas of significant or intense pain, consult with your healthcare provider.  Other Considerations . General physical activity including bending and stretching can have a beneficial massage-like effect on the colon.  Deep breathing can also stimulate the colon because breathing deeply activates the same nervous system that supplies the colon.   . Abdominal massage should always be used in combination with a bowel-conscious diet that is high in the proper type of fiber for you, fluids (primarily water), and a regular exercise program. [facility] [address], [place], [place] [address] Phone # [phone] Fax [phone]

## 2017-06-27 NOTE — Therapy (Signed)
[facility] [address] [place], [place], [address] Phone: [phone]   Fax:  [phone]  Physical Therapy Treatment  Patient Details  Name: Amy Burton MRN: [id number] Date of Birth: 07-29-1961 Referring Provider: Dr. Lyndal Pulley  Encounter Date: 06/27/2017      PT End of Session - 06/27/17 1016    Visit Number 2   Date for PT Re-Evaluation 09/19/17   Authorization Type [employer]   Authorization - Visit Number 2   Authorization - Number of Visits 30   PT Start Time 0930   PT Stop Time 1013   PT Time Calculation (min) 43 min   Activity Tolerance Patient tolerated treatment well   Behavior During Therapy [facility] for tasks assessed/performed      Past Medical History:  Diagnosis Date  . Colon polyp   . Depression   . Thyroid disease     Past Surgical History:  Procedure Laterality Date  . BREAST SURGERY    . CESAREAN SECTION    . endomitriosis  1995    There were no vitals filed for this visit.      Subjective Assessment - 06/27/17 0936    Subjective Patient reports she is able to be on her feet with less pain.  Patient fell last night in the dark and walked on unlevel surface.  Patient caught herself on the left knee and was sore.  She is better today.    Patient Stated Goals reduce pain and improve strength   Currently in Pain? No/denies                         [facility] Adult PT Treatment/Exercise - 06/27/17 0001      Manual Therapy   Manual Therapy Soft tissue mobilization;Myofascial release   Soft tissue mobilization scar massage to lower abdomen   Myofascial Release to lower abdoment going through planes of fascia, loosening the intestines, and releasing around the bladder                PT Education - 06/27/17 1016    Education provided Yes   Education Details toileting technique, abdominal massage; you tube video for toilet meditation   Person(s) Educated  Patient   Methods Explanation;Demonstration;Verbal cues;Handout   Comprehension Returned demonstration;Verbalized understanding          PT Short Term Goals - 06/27/17 1016      PT SHORT TERM GOAL #1   Title independent with initial HEP with flexibility exercises   Time 4   Period Weeks   Status On-going     PT SHORT TERM GOAL #2   Title understand correct toileting technique to reduce strain on the pelvic floor   Time 4   Period Weeks   Status On-going     PT SHORT TERM GOAL #3   Title ability to contract lower abdominals to begin core stabilization   Time 4   Period Weeks   Status On-going     PT SHORT TERM GOAL #4   Title understand how to perform abdominal massage to promote improved bowel health and decrease strain on the pelvic floor   Time 4   Period Weeks   Status On-going           PT Long Term Goals - 06/20/17 1547      PT LONG TERM GOAL #1   Title independent with HEP and understand how to progress herself   Time  12   Period Weeks   Status New   Target Date 09/12/17     PT LONG TERM GOAL #2   Title falling out feeling in the vaginal areal decreased >/= 50% due to improve tone of abdominals and pelvic floor   Time 12   Period Weeks   Status New   Target Date 09/12/17     PT LONG TERM GOAL #3   Title pain in the vaginal area decreased >/= 75% due to improved tissue mobility and tenderness in the vaginal canal   Time 12   Period Weeks   Status New   Target Date 09/12/17     PT LONG TERM GOAL #4   Title straining with a bowel movement decreased >/= 50% due to improved bowel health and coordination of the pelvic floor muscles   Time 12   Status New   Target Date 09/12/17               Plan - 06/27/17 1011    Clinical Impression Statement Patient is able to contract the pelvic floor better and less lower abdominal pain.  Patient is able to stand for the day with less pain.  Patient has learned toileting technique and abdominal massage  to reduce constipation.  Patient had tightness in the lower abdominal that was released with soft tissue work. Patient will benefit from skilled therapy to improve muscle strength and coordination and reduce pain.    Rehab Potential Excellent   Clinical Impairments Affecting Rehab Potential s/p laproscopic surgery for endometriosis   PT Frequency 1x / week   PT Duration 12 weeks   PT Treatment/Interventions Biofeedback;Cryotherapy;Electrical Stimulation;Moist Heat;Ultrasound;Patient/family education;Neuromuscular re-education;Therapeutic exercise;Therapeutic activities;Manual techniques;Dry needling;Scar mobilization   PT Next Visit Plan internal soft tissue work; modalities for pain; scar massage; abdominal massage;   PT Home Exercise Plan progress as needed   Recommended Other Services MD signed the initial note   Consulted and Agree with Plan of Care Patient      Patient will benefit from skilled therapeutic intervention in order to improve the following deficits and impairments:  Pain, Decreased strength, Decreased activity tolerance, Increased fascial restricitons, Increased muscle spasms, Decreased scar mobility  Visit Diagnosis: Muscle weakness (generalized)  Cramp and spasm     Problem List Patient Active Problem List   Diagnosis Date Noted  . Nonallopathic lesion of thoracic region 05/15/2017  . Nonallopathic lesion of cervical region 05/15/2017  . Nonallopathic lesion of sacral region 05/15/2017  . Chronic neck pain 04/30/2017  . Chronic back pain 04/30/2017  . Arthralgia of hip 04/30/2017  . Pelvic prolapse 04/30/2017  . 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 Eczema 09/07/2016  . Urinary urgency 09/07/2016  . Chronic sinusitis 09/29/2015  . Allergic rhinitis 09/29/2015  . Hyperglycemia 09/29/2015  . Vitamin D deficiency 09/29/2015  . Situational anxiety-flying 09/29/2015  . ETD (Eustachian tube dysfunction), bilateral 09/29/2015   Earlie Counts, PT 06/27/17 10:18  AM   [facility] [address] [place], [place], [address] Phone: [phone]   Fax:  [phone]  Name: Amy Burton MRN: [id number] Date of Birth: 11-12-1960

## 2017-07-04 ENCOUNTER — Ambulatory Visit: Payer: 59 | Admitting: Physical Therapy

## 2017-07-04 ENCOUNTER — Encounter: Payer: Self-pay | Admitting: Physical Therapy

## 2017-07-04 DIAGNOSIS — M6281 Muscle weakness (generalized): Secondary | ICD-10-CM

## 2017-07-04 DIAGNOSIS — R252 Cramp and spasm: Secondary | ICD-10-CM

## 2017-07-04 NOTE — Therapy (Signed)
[facility] [address] [place], [place], [address] Phone: [phone]   Fax:  [phone]  Physical Therapy Treatment  Patient Details  Name: Amy Burton MRN: [id number] Date of Birth: 24-May-1961 Referring Provider: Dr. Lyndal Pulley   Encounter Date: 07/04/2017  PT End of Session - 07/04/17 0950    Visit Number  3    Date for PT Re-Evaluation  09/19/17    Authorization Type  [employer]    Authorization - Visit Number  3    Authorization - Number of Visits  30    PT Start Time  [phone] patient came late   patient came late   PT Stop Time  0940    PT Time Calculation (min)  48 min    Activity Tolerance  Patient tolerated treatment well    Behavior During Therapy  [facility] for tasks assessed/performed       Past Medical History:  Diagnosis Date  . Colon polyp   . Depression   . Thyroid disease     Past Surgical History:  Procedure Laterality Date  . BREAST SURGERY    . CESAREAN SECTION    . endomitriosis  1995    There were no vitals filed for this visit.  Subjective Assessment - 07/04/17 0853    Subjective  I feel good from last visit.  Sometimes I get a little sore. Using a squatty potty has helped me have a bowel movement easier. Abdominal massage is helping. Things are moving  due to being an antihistamine diet.     Patient Stated Goals  reduce pain and improve strength    Currently in Pain?  No/denies                   Pelvic Floor Special Questions - 07/04/17 0001    Pelvic Floor Internal Exam  patient confims identification and approves PT to assess pelvic floor muscle integrity    Exam Type  Vaginal        [facility] Adult PT Treatment/Exercise - 07/04/17 0001      Self-Care   Self-Care  Other Self-Care Comments    Other Self-Care Comments   discussed with patient on device to use for pelvic floor massage and where to purchase, discussed how to use the device to massage the pelvic  floor muscles      Lumbar Exercises: Supine   Ab Set  10 reps;3 seconds    AB Set Limitations  tactile cues needed and squeeze ball between knees for 3 reps    Clam  20 reps;1 second      Manual Therapy   Manual Therapy  Soft tissue mobilization;Internal Pelvic Floor    Soft tissue mobilization  scar massage to lower abdomen    Internal Pelvic Floor   bil. urethra sphincter; bil. ischiococcygeus             PT Education - 07/04/17 0941    Education provided  Yes    Education Details  information on a pelvic floor massager; lower abdominal contraction with hip in/out    Person(s) Educated  Patient    Methods  Explanation;Demonstration;Verbal cues    Comprehension  Returned demonstration;Verbalized understanding       PT Short Term Goals - 07/04/17 0858      PT SHORT TERM GOAL #1   Title  independent with initial HEP with flexibility exercises    Time  4    Period  Weeks  Status  On-going      PT SHORT TERM GOAL #2   Time  4    Period  Weeks    Status  Achieved      PT SHORT TERM GOAL #3   Title  ability to contract lower abdominals to begin core stabilization    Time  4    Period  Weeks    Status  Achieved      PT SHORT TERM GOAL #4   Title  understand how to perform abdominal massage to promote improved bowel health and decrease strain on the pelvic floor    Time  4    Period  Weeks    Status  Achieved        PT Long Term Goals - 07/04/17 2620      PT LONG TERM GOAL #2   Title  falling out feeling in the vaginal areal decreased >/= 50% due to improve tone of abdominals and pelvic floor    Baseline  decreased by 30%    Time  12    Period  Weeks    Status  On-going            Plan - 07/04/17 0951    Clinical Impression Statement  Patient has increased softness on the left pelvic floor muscles. Patient reports the falling out feeling has decreased by 30& and intensity of the pain while playing with her grandson has decreased by 30%. Patient is  making herself rest every afternoon. Pelvic floor strength is 4/5. Patient has improved mobility of scar in lower abdomen.  Patient will benefit from skilled therapy to improve muscle strength and coordination and reduce pain.     Rehab Potential  Excellent    Clinical Impairments Affecting Rehab Potential  s/p laproscopic surgery for endometriosis    PT Frequency  1x / week    PT Duration  12 weeks    PT Treatment/Interventions  Biofeedback;Cryotherapy;Electrical Stimulation;Moist Heat;Ultrasound;Patient/family education;Neuromuscular re-education;Therapeutic exercise;Therapeutic activities;Manual techniques;Dry needling;Scar mobilization    PT Next Visit Plan  progress abdominal strength with marching; bridging; quadruped lift alternate extremity, stretches    PT Home Exercise Plan  progress as needed    Consulted and Agree with Plan of Care  Patient       Patient will benefit from skilled therapeutic intervention in order to improve the following deficits and impairments:  Pain, Decreased strength, Decreased activity tolerance, Increased fascial restricitons, Increased muscle spasms, Decreased scar mobility  Visit Diagnosis: Muscle weakness (generalized)  Cramp and spasm     Problem List Patient Active Problem List   Diagnosis Date Noted  . Nonallopathic lesion of thoracic region 05/15/2017  . Nonallopathic lesion of cervical region 05/15/2017  . Nonallopathic lesion of sacral region 05/15/2017  . Chronic neck pain 04/30/2017  . Chronic back pain 04/30/2017  . Arthralgia of hip 04/30/2017  . Pelvic prolapse 04/30/2017  . 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 Eczema 09/07/2016  . Urinary urgency 09/07/2016  . Chronic sinusitis 09/29/2015  . Allergic rhinitis 09/29/2015  . Hyperglycemia 09/29/2015  . Vitamin D deficiency 09/29/2015  . Situational anxiety-flying 09/29/2015  . ETD (Eustachian tube dysfunction), bilateral 09/29/2015    Earlie Counts, PT 07/04/17 9:58  AM   [facility] [address] [place], [place], [address] Phone: [phone]   Fax:  [phone]  Name: Amy Burton MRN: [id number] Date of Birth: Sep 14, 1960

## 2017-07-04 NOTE — Patient Instructions (Addendum)
Bracing With Knee Fallout (Hook-Lying)    With neutral spine, tighten pelvic floor and abdominals and hold.  Drop knee out to side 10X. Keep opposite hip still. Repeat _10__ times on next side. Do _1__ times a day.   Copyright  VHI. All rights reserved.     [employer] Flexible Head Vibrator, Sex Toy, Clitoral Stimulator, G Spot, Waterproof, One Charity fundraiser)    [employer] [address], [place], [place] [address] Phone # [phone] Fax [phone]

## 2017-07-10 ENCOUNTER — Ambulatory Visit (INDEPENDENT_AMBULATORY_CARE_PROVIDER_SITE_OTHER): Payer: PRIVATE HEALTH INSURANCE | Admitting: Family Medicine

## 2017-07-10 ENCOUNTER — Encounter: Payer: Self-pay | Admitting: Family Medicine

## 2017-07-10 VITALS — BP 118/88 | HR 77 | Ht 64.5 in | Wt 172.0 lb

## 2017-07-10 DIAGNOSIS — M549 Dorsalgia, unspecified: Secondary | ICD-10-CM

## 2017-07-10 DIAGNOSIS — G8929 Other chronic pain: Secondary | ICD-10-CM

## 2017-07-10 DIAGNOSIS — M999 Biomechanical lesion, unspecified: Secondary | ICD-10-CM

## 2017-07-10 NOTE — Assessment & Plan Note (Signed)
Decision today to treat with OMT was based on Physical Exam  After verbal consent patient was treated with HVLA, ME, FPR techniques in cervical, thoracic, lumbar and sacral areas  Patient tolerated the procedure well with improvement in symptoms  Patient given exercises, stretches and lifestyle modifications  See medications in patient instructions if given  Patient will follow up in 5 weeks

## 2017-07-10 NOTE — Assessment & Plan Note (Signed)
Patient still has significant muscle imbalances.  Encourage patient continue to try to do the home exercises and working on core strength including hip abductor strengthening.  Patient has been doing relatively well we will have patient come back again in 5 weeks.  Responding well to osteopathic manipulation

## 2017-07-10 NOTE — Progress Notes (Signed)
[facility] [place], [place] [address] Phone: [phone] Subjective:    I'm seeing this patient by the request  of:    CC: Back pain  [id number]  Amy Burton is a 56 y.o. female coming in for follow up for back pain. She is having right sided lower back pain since Sunday. Thursday she was at therapy and did have some radicular symptoms on Friday. She had a setback on Sunday with her back after sexual activity.  States when she does the activities of the exercises seem to be doing relatively better though.  Patient denies any radiation of the legs.      Past Medical History:  Diagnosis Date  . Colon polyp   . Depression   . Thyroid disease    Past Surgical History:  Procedure Laterality Date  . BREAST SURGERY    . CESAREAN SECTION    . endomitriosis  1995   Social History   Socioeconomic History  . Marital status: Married    Spouse name: Not on file  . Number of children: Not on file  . Years of education: Not on file  . Highest education level: Not on file  Social Needs  . Financial resource strain: Not on file  . Food insecurity - worry: Not on file  . Food insecurity - inability: Not on file  . Transportation needs - medical: Not on file  . Transportation needs - non-medical: Not on file  Occupational History  . Not on file  Tobacco Use  . Smoking status: Never Smoker  . Smokeless tobacco: Never Used  Substance and Sexual Activity  . Alcohol use: Yes  . Drug use: No  . Sexual activity: Not on file  Other Topics Concern  . Not on file  Social History Narrative  . Not on file   Allergies  Allergen Reactions  . Sulfa Antibiotics    Family History  Problem Relation Age of Onset  . Hyperlipidemia Mother   . Hypertension Mother   . Diabetes Father   . Arthritis Sister   . Heart disease Brother   . Heart attack Brother   . Cancer Maternal Grandmother        breast     Past medical history,  social, surgical and family history all reviewed in electronic medical record.  No pertanent information unless stated regarding to the chief complaint.   Review of Systems:Review of systems updated and as accurate as of 07/10/17  No headache, visual changes, nausea, vomiting, diarrhea, constipation, dizziness, abdominal pain, skin rash, fevers, chills, night sweats, weight loss, swollen lymph nodes, body aches, joint swelling, muscle aches, chest pain, shortness of breath, mood changes.   Objective  There were no vitals taken for this visit. Systems examined below as of 07/10/17   General: No apparent distress alert and oriented x3 mood and affect normal, dressed appropriately.  HEENT: Pupils equal, extraocular movements intact  Respiratory: Patient's speak in full sentences and does not appear short of breath  Cardiovascular: No lower extremity edema, non tender, no erythema  Skin: Warm dry intact with no signs of infection or rash on extremities or on axial skeleton.  Abdomen: Soft nontender  Neuro: Cranial nerves II through XII are intact, neurovascularly intact in all extremities with 2+ DTRs and 2+ pulses.  Lymph: No lymphadenopathy of posterior or anterior cervical chain or axillae bilaterally.  Gait normal with good balance and coordination.  MSK:  Non tender with full  range of motion and good stability and symmetric strength and tone of shoulders, elbows, wrist, hip, knee and ankles bilaterally.  Back Exam:  Inspection: Unremarkable  Motion: Flexion 45 deg, Extension 25 deg, Side Bending to 35 deg bilaterally,  Rotation to 45 deg bilaterally  SLR laying: Negative  XSLR laying: Negative  Palpable tenderness: Tender to palpation of the paraspinal musculature of the lumbar spine.  Seems to be in the thoracolumbar and sacral area right greater than left. FABER: Right. Sensory change: Gross sensation intact to all lumbar and sacral dermatomes.  Reflexes: 2+ at both patellar tendons,  2+ at achilles tendons, Babinski's downgoing.  Strength at foot  Plantar-flexion: 5/5 Dorsi-flexion: 5/5 Eversion: 5/5 Inversion: 5/5  Leg strength  Quad: 5/5 Hamstring: 5/5 Hip flexor: 5/5 Hip abductors: 5/5  Gait unremarkable.  Osteopathic findings C6 flexed rotated and side bent left T3 extended rotated and side bent right inhaled third rib T6 extended rotated and side bent left L3 flexed rotated and side bent right Sacrum right on right    Impression and Recommendations:     This case required medical decision making of moderate complexity.      Note: This dictation was prepared with Dragon dictation along with smaller phrase technology. Any transcriptional errors that result from this process are unintentional.

## 2017-07-10 NOTE — Patient Instructions (Signed)
Good to see you  5 weeks!

## 2017-07-11 ENCOUNTER — Ambulatory Visit: Payer: 59 | Admitting: Physical Therapy

## 2017-07-11 ENCOUNTER — Encounter: Payer: Self-pay | Admitting: Physical Therapy

## 2017-07-11 DIAGNOSIS — M6281 Muscle weakness (generalized): Secondary | ICD-10-CM

## 2017-07-11 DIAGNOSIS — R252 Cramp and spasm: Secondary | ICD-10-CM

## 2017-07-11 NOTE — Therapy (Signed)
[facility] [address] [place], [place], [address] Phone: [phone]   Fax:  [phone]  Physical Therapy Treatment  Patient Details  Name: Amy Burton MRN: [id number] Date of Birth: [birth year] Referring Provider: Dr. Lyndal Pulley   Encounter Date: 07/11/2017  PT End of Session - 07/11/17 0930    Visit Number  4    Date for PT Re-Evaluation  09/19/17    Authorization Type  [employer]    Authorization - Visit Number  4    Authorization - Number of Visits  30    PT Start Time  [phone] patient was late    PT Stop Time  0930    PT Time Calculation (min)  39 min    Activity Tolerance  Patient tolerated treatment well    Behavior During Therapy  [facility] for tasks assessed/performed       Past Medical History:  Diagnosis Date  . Colon polyp   . Depression   . Thyroid disease     Past Surgical History:  Procedure Laterality Date  . BREAST SURGERY    . CESAREAN SECTION    . endomitriosis  1995    There were no vitals filed for this visit.  Subjective Assessment - 07/11/17 0852    Subjective  I feel better today.  I saw doctor due to right hip pain.  After I left last visit I had stuff going down my right hip.      Patient Stated Goals  reduce pain and improve strength    Currently in Pain?  Yes    Pain Score  2     Pain Location  Back    Pain Orientation  Right    Pain Descriptors / Indicators  Sharp;Dull    Pain Type  Chronic pain    Pain Onset  More than a month ago    Pain Frequency  Intermittent    Aggravating Factors   lifting right leg, getting out of bed    Pain Relieving Factors  lay on back with feet elevated    Multiple Pain Sites  No                      [facility] Adult PT Treatment/Exercise - 07/11/17 0001      Lumbar Exercises: Supine   Ab Set  10 reps;5 seconds    AB Set Limitations  tactile cues needed and squeeze ball between knees for 3 reps    Clam  20 reps;1 second  tactile cues to keep hips leveled      Manual Therapy   Manual Therapy  Soft tissue mobilization;Myofascial release    Soft tissue mobilization  scar massage to lower abdomen    Myofascial Release  to abdoment to release the midline of the abdomen, and diaphram             PT Education - 07/11/17 5093    Education provided  Yes    Education Details  abdominal brace with pelvic floor contraction with toes raises.     Person(s) Educated  Patient    Methods  Explanation;Demonstration;Verbal cues;Handout    Comprehension  Returned demonstration;Verbalized understanding       PT Short Term Goals - 07/11/17 0901      PT SHORT TERM GOAL #1   Title  independent with initial HEP with flexibility exercises    Time  4    Period  Weeks    Status  Achieved        PT Long Term Goals - 07/11/17 0903      PT LONG TERM GOAL #2   Title  falling out feeling in the vaginal areal decreased >/= 50% due to improve tone of abdominals and pelvic floor    Baseline  when back went out she had to urinate every hour    Time  12    Period  Weeks    Status  On-going      PT LONG TERM GOAL #3   Title  pain in the vaginal area decreased >/= 75% due to improved tissue mobility and tenderness in the vaginal canal    Baseline  had stabbing pain in the vagina    Time  12    Period  Weeks    Status  On-going      PT LONG TERM GOAL #4   Title  straining with a bowel movement decreased >/= 50% due to improved bowel health and coordination of the pelvic floor muscles    Time  12    Period  Weeks    Status  New            Plan - 07/11/17 0855    Clinical Impression Statement  Patient has more strength with clam movement on the left.  Patient had a flare-up  so she was not able to do her exercises.  Patient is low level core due to certain exercises increase back pain.  Patient has improve mobility of abdominal scar.  Patient will benefit from skilled therapy to improve muscle strength and  coordination and reduce pain.     Rehab Potential  Excellent    Clinical Impairments Affecting Rehab Potential  s/p laproscopic surgery for endometriosis    PT Frequency  1x / week    PT Duration  12 weeks    PT Treatment/Interventions  Biofeedback;Cryotherapy;Electrical Stimulation;Moist Heat;Ultrasound;Patient/family education;Neuromuscular re-education;Therapeutic exercise;Therapeutic activities;Manual techniques;Dry needling;Scar mobilization    PT Next Visit Plan  progress abdominal strength with marching; bridging; quadruped lift alternate extremity, stretches    PT Home Exercise Plan  progress as needed    Consulted and Agree with Plan of Care  Patient       Patient will benefit from skilled therapeutic intervention in order to improve the following deficits and impairments:  Pain, Decreased strength, Decreased activity tolerance, Increased fascial restricitons, Increased muscle spasms, Decreased scar mobility  Visit Diagnosis: Muscle weakness (generalized)  Cramp and spasm     Problem List Patient Active Problem List   Diagnosis Date Noted  . Nonallopathic lesion of thoracic region 05/15/2017  . Nonallopathic lesion of cervical region 05/15/2017  . Nonallopathic lesion of sacral region 05/15/2017  . Chronic neck pain 04/30/2017  . Chronic back pain 04/30/2017  . Arthralgia of hip 04/30/2017  . Pelvic prolapse 04/30/2017  . 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 Eczema 09/07/2016  . Urinary urgency 09/07/2016  . Chronic sinusitis 09/29/2015  . Allergic rhinitis 09/29/2015  . Hyperglycemia 09/29/2015  . Vitamin D deficiency 09/29/2015  . Situational anxiety-flying 09/29/2015  . ETD (Eustachian tube dysfunction), bilateral 09/29/2015    Earlie Counts, PT 07/11/17 9:33 AM   [facility] [address] [place], [place], [address] Phone: [phone]   Fax:  [phone]  Name: Amy Burton MRN:  [id number] Date of Birth: 04-Jul-1961

## 2017-07-11 NOTE — Patient Instructions (Addendum)
Abdominal Bracing With Pelvic Floor (Hook-Lying)    With neutral spine, tighten pelvic floor and abdominals. Lift right toes up 10x then left toes 10x  Do __1_ times a day.   Copyright  VHI. All rights reserved.    [facility] [address], [place], [place] [address] Phone # [phone] Fax [phone]

## 2017-07-17 ENCOUNTER — Ambulatory Visit: Payer: 59 | Admitting: Physical Therapy

## 2017-07-17 ENCOUNTER — Encounter: Payer: Self-pay | Admitting: Physical Therapy

## 2017-07-17 DIAGNOSIS — M6281 Muscle weakness (generalized): Secondary | ICD-10-CM | POA: Diagnosis not present

## 2017-07-17 DIAGNOSIS — R252 Cramp and spasm: Secondary | ICD-10-CM

## 2017-07-17 NOTE — Patient Instructions (Signed)

## 2017-07-17 NOTE — Therapy (Signed)
[facility] [address], [place], [place], [address] Phone: [phone]   Fax:  [phone]  Physical Therapy Treatment  Patient Details  Name: Amy Burton MRN: [id number] Date of Birth: 1961-05-14 Referring Provider: Dr. Lyndal Pulley   Encounter Date: 07/17/2017  PT End of Session - 07/17/17 1013    Visit Number  5    Date for PT Re-Evaluation  09/19/17    Authorization Type  [employer]    Authorization - Visit Number  5    Authorization - Number of Visits  30    PT Start Time  0930    PT Stop Time  1011    PT Time Calculation (min)  41 min    Activity Tolerance  Patient tolerated treatment well    Behavior During Therapy  [facility] for tasks assessed/performed       Past Medical History:  Diagnosis Date  . Colon polyp   . Depression   . Thyroid disease     Past Surgical History:  Procedure Laterality Date  . BREAST SURGERY    . CESAREAN SECTION    . endomitriosis  1995    There were no vitals filed for this visit.  Subjective Assessment - 07/17/17 0934    Subjective  I am sore in the lower back.  I so not sit down much.  I do everything around the house.  I am finally able to do things. I have been on my feet for days and I do not feel like things are falling out.     Patient Stated Goals  reduce pain and improve strength    Currently in Pain?  Yes    Pain Score  4  just sitting is 1/10    Pain Location  Back    Pain Orientation  Right    Pain Descriptors / Indicators  Sharp;Dull    Pain Type  Chronic pain    Pain Onset  More than a month ago    Pain Frequency  Intermittent    Aggravating Factors   lifting right leg, getting out of bed    Pain Relieving Factors  lay on back with feet elevated    Multiple Pain Sites  No                      [facility] Adult PT Treatment/Exercise - 07/17/17 0001      Manual Therapy   Manual Therapy  Soft tissue mobilization    Myofascial Release   lumbar paraspinals, bil. quadratus, bil. piriformis, bil. gluteus medius and minimus       Trigger Point Dry Needling - 07/17/17 1011    Consent Given?  Yes    Education Handout Provided  Yes    Muscles Treated Upper Body  Quadratus Lumborum;Gluteus minimus;Gluteus maximus;Piriformis bil. lumbar multifidi L1-L5    Muscles Treated Lower Body  Gluteus minimus;Gluteus maximus;Piriformis    Gluteus Maximus Response  Twitch response elicited;Palpable increased muscle length    Gluteus Minimus Response  Twitch response elicited;Palpable increased muscle length    Piriformis Response  Twitch response elicited           PT Education - 07/17/17 1009    Education provided  Yes    Education Details  dry needling    Person(s) Educated  Patient    Methods  Explanation;Handout    Comprehension  Verbalized understanding       PT Short Term Goals - 07/11/17 0901  PT SHORT TERM GOAL #1   Title  independent with initial HEP with flexibility exercises    Time  4    Period  Weeks    Status  Achieved        PT Long Term Goals - 07/17/17 1219      PT LONG TERM GOAL #4   Title  straining with a bowel movement decreased >/= 50% due to improved bowel health and coordination of the pelvic floor muscles    Baseline  10% better    Time  12    Period  Weeks    Status  On-going            Plan - 07/17/17 7588    Clinical Impression Statement  Patient reports no falling out feeling since last visit. Patient is now able to perform hookly abdominal contraction with lifting her heels up for first time.  Patient has trigger points in the lumbar and gluteal region.  After therapy patient reports increased mobility and less muscle tension.  Patient will benefit from skilled therapy to improve muscle strength and coordination and reduce pain.     Rehab Potential  Excellent    Clinical Impairments Affecting Rehab Potential  s/p laproscopic surgery for endometriosis    PT Frequency  1x / week     PT Duration  12 weeks    PT Treatment/Interventions  Biofeedback;Cryotherapy;Electrical Stimulation;Moist Heat;Ultrasound;Patient/family education;Neuromuscular re-education;Therapeutic exercise;Therapeutic activities;Manual techniques;Dry needling;Scar mobilization    PT Next Visit Plan  progress abdominal strength with marching; bridging; quadruped lift alternate extremity, stretches    PT Home Exercise Plan  progress as needed    Consulted and Agree with Plan of Care  Patient       Patient will benefit from skilled therapeutic intervention in order to improve the following deficits and impairments:  Pain, Decreased strength, Decreased activity tolerance, Increased fascial restricitons, Increased muscle spasms, Decreased scar mobility  Visit Diagnosis: Muscle weakness (generalized)  Cramp and spasm     Problem List Patient Active Problem List   Diagnosis Date Noted  . Nonallopathic lesion of thoracic region 05/15/2017  . Nonallopathic lesion of cervical region 05/15/2017  . Nonallopathic lesion of sacral region 05/15/2017  . Chronic neck pain 04/30/2017  . Chronic back pain 04/30/2017  . Arthralgia of hip 04/30/2017  . Pelvic prolapse 04/30/2017  . 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 Eczema 09/07/2016  . Urinary urgency 09/07/2016  . Chronic sinusitis 09/29/2015  . Allergic rhinitis 09/29/2015  . Hyperglycemia 09/29/2015  . Vitamin D deficiency 09/29/2015  . Situational anxiety-flying 09/29/2015  . ETD (Eustachian tube dysfunction), bilateral 09/29/2015    Earlie Counts, PT 07/17/17 10:16 AM   [facility] [address], [place], [place], [address] Phone: [phone]   Fax:  [phone]  Name: Itsel Opfer MRN: [id number] Date of Birth: 08/04/1961

## 2017-07-25 ENCOUNTER — Ambulatory Visit: Payer: 59 | Admitting: Physical Therapy

## 2017-07-25 ENCOUNTER — Encounter: Payer: Self-pay | Admitting: Physical Therapy

## 2017-07-25 DIAGNOSIS — M6281 Muscle weakness (generalized): Secondary | ICD-10-CM

## 2017-07-25 DIAGNOSIS — R252 Cramp and spasm: Secondary | ICD-10-CM

## 2017-07-25 NOTE — Therapy (Signed)
[facility] [address] [place], [place], [address] Phone: [phone]   Fax:  [phone]  Physical Therapy Treatment  Patient Details  Name: Amy Burton MRN: [id number] Date of Birth: 07/24/1961 Referring Provider: Dr. Lyndal Pulley   Encounter Date: 07/25/2017  PT End of Session - 07/25/17 1016    Visit Number  6    Date for PT Re-Evaluation  09/19/17    Authorization Type  [employer]ign[employer]    Authorization - Visit Number  6    Authorization - Number of Visits  30    PT Start Time  0930    PT Stop Time  1011    PT Time Calculation (min)  41 min    Activity Tolerance  Patient tolerated treatment well    Behavior During Therapy  [facility] for tasks assessed/performed       Past Medical History:  Diagnosis Date  . Colon polyp   . Depression   . Thyroid disease     Past Surgical History:  Procedure Laterality Date  . BREAST SURGERY    . CESAREAN SECTION    . endomitriosis  1995    There were no vitals filed for this visit.  Subjective Assessment - 07/25/17 0935    Subjective  I was sore due to moving my christmas decorations.  I do not feel like my insides are falling out. Stand in kitchen and standing to cook increased pain in back. I have been straining to go to the bathroom.     Patient Stated Goals  reduce pain and improve strength    Currently in Pain?  Yes    Pain Score  1     Pain Location  Back    Pain Orientation  Lower    Pain Descriptors / Indicators  Dull;Sharp    Pain Type  Chronic pain    Pain Onset  More than a month ago    Pain Frequency  Intermittent    Aggravating Factors   lifting right leg, getting out of bed    Pain Relieving Factors  lay on back with feet elevated    Multiple Pain Sites  No                      [facility] Adult PT Treatment/Exercise - 07/25/17 0001      Self-Care   Self-Care  Other Self-Care Comments    Other Self-Care Comments   educated pateint on  what a rectocele is and ways to manage it      Therapeutic Activites    Therapeutic Activities  Lifting    Lifting  lifting without holding breath, wider base of support and flexing at her hips.              PT Education - 07/25/17 1015    Education provided  Yes    Education Details  information on a rectocele, lifting, core strength    Person(s) Educated  Patient    Methods  Explanation;Demonstration;Verbal cues;Handout    Comprehension  Returned demonstration;Verbalized understanding       PT Short Term Goals - 07/11/17 0901      PT SHORT TERM GOAL #1   Title  independent with initial HEP with flexibility exercises    Time  4    Period  Weeks    Status  Achieved        PT Long Term Goals - 07/25/17 1016  PT LONG TERM GOAL #1   Title  independent with HEP and understand how to progress herself    Baseline  still learning    Time  12    Period  Weeks    Status  On-going      PT LONG TERM GOAL #2   Title  falling out feeling in the vaginal areal decreased >/= 50% due to improve tone of abdominals and pelvic floor    Baseline  reduced    Time  12    Period  Weeks    Status  On-going      PT LONG TERM GOAL #3   Title  pain in the vaginal area decreased >/= 75% due to improved tissue mobility and tenderness in the vaginal canal    Baseline  had stabbing pain in the vagina    Time  12    Period  Weeks    Status  On-going      PT LONG TERM GOAL #4   Title  straining with a bowel movement decreased >/= 50% due to improved bowel health and coordination of the pelvic floor muscles    Baseline  10% better    Time  12    Period  Weeks    Status  On-going            Plan - 07/25/17 0958    Clinical Impression Statement  Patient was education on rectocele and how to splint due to weakness in the posterior wall of the vagina.  Patient was educated on how to lift correctly and not perform a valsalva maneuver to decrease strain on the rectocele.  Patient  is straining to have a bowel movement so toileting was reviewed.  Patient will benefit from skilled therapy to improve muscle strength and coordination and reduce pain.     Rehab Potential  Excellent    Clinical Impairments Affecting Rehab Potential  s/p laproscopic surgery for endometriosis    PT Treatment/Interventions  Biofeedback;Cryotherapy;Electrical Stimulation;Moist Heat;Ultrasound;Patient/family education;Neuromuscular re-education;Therapeutic exercise;Therapeutic activities;Manual techniques;Dry needling;Scar mobilization    PT Next Visit Plan  progress abdominal strength with marching; stretches; soft tissue work    PT Home Exercise Plan  progress as needed    Consulted and Agree with Plan of Care  Patient       Patient will benefit from skilled therapeutic intervention in order to improve the following deficits and impairments:  Pain, Decreased strength, Decreased activity tolerance, Increased fascial restricitons, Increased muscle spasms, Decreased scar mobility  Visit Diagnosis: Muscle weakness (generalized)  Cramp and spasm     Problem List Patient Active Problem List   Diagnosis Date Noted  . Nonallopathic lesion of thoracic region 05/15/2017  . Nonallopathic lesion of cervical region 05/15/2017  . Nonallopathic lesion of sacral region 05/15/2017  . Chronic neck pain 04/30/2017  . Chronic back pain 04/30/2017  . Arthralgia of hip 04/30/2017  . Pelvic prolapse 04/30/2017  . 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 Eczema 09/07/2016  . Urinary urgency 09/07/2016  . Chronic sinusitis 09/29/2015  . Allergic rhinitis 09/29/2015  . Hyperglycemia 09/29/2015  . Vitamin D deficiency 09/29/2015  . Situational anxiety-flying 09/29/2015  . ETD (Eustachian tube dysfunction), bilateral 09/29/2015    Earlie Counts, PT 07/25/17 10:18 AM    [facility] [address] [place], [place], [address] Phone: [phone]    Fax:  [phone]  Name: Amy Burton MRN: [id number] Date of Birth: March 06, 1961

## 2017-07-25 NOTE — Patient Instructions (Addendum)
About Rectocele Overview A rectocele is a type of hernia which causes different degrees of bulging of the rectal tissues into the vaginal wall. You may even notice that it presses against the vaginal wall so much that some vaginal tissues droop outside of the opening of your vagina.  Causes of Rectocele The most common cause is childbirth. The muscles and ligaments in the pelvis that hold up and support the female organs and vagina become stretched and weakened during labor and delivery. The more babies you have, the more the support tissues are stretched and weakened. Not everyone who has a baby will develop a rectocele. Some women have stronger supporting tissue in the pelvis and may not have as much of a problem as others. Women who have a Cesarean section usually do not get rectoceles unless they pushed a long time prior to the cesarean delivery.  Other conditions that can cause a rectocele include chronic constipation, a chronic cough, a lot of heavy lifting, and obesity. Older women may have this problem because the loss of female hormones causes the vaginal tissue to become weaker.  Symptoms There may not be any symptoms. If you do have symptoms, they may include:  . pelvic pressure in the rectal area  . protrusion of the lower part of the vagina through the opening of the vagina  . constipation and trapping of the stool, making it difficult to have a bowel movement. In severe cases, you may have to press on the lower part of your vagina to help push the stool out of your rectum.  This is called splinting to empty. Diagnosing Rectocele Your health care provider will ask about your symptoms and perform a pelvic exam. S/he will ask you to bear down, pushing like you are having a bowel movement so as to see how far the lower part of the vagina protrudes into the vagina and possibly outside of the vagina. Your provider will also ask you to contract the muscles of your pelvis (like you are stopping  the stream in the middle of urinating) to determine the strength of your pelvic muscles. Your provider may also do a rectal exam.    Treatment Options If you do not have any symptoms, no treatment may be necessary. Other treatment options include:  . Pelvic floor exercises: Contracting the muscles in your genital area may help strengthen your muscles and support the organs.  Be sure to get proper exercise instruction from your physical therapist. . A pessary (removable pelvic support device) sometimes helps rectocele symptoms. . Surgery: Surgical repair may be necessary. In some cases the uterus may need to be taken out (a hysterectomy) as well.  There are many types of surgery for pelvic support problems.  Look for physicians who specialize in repair procedures. You can take care of yourself by:  . treating and preventing constipation  . avoiding heavy lifting, and lifting correctly (with your legs, not with your waist or back)  . treating a chronic cough or bronchitis  . not smoking  . avoiding too much weight gain  . doing pelvic floor exercises     Squat above a chair and do not sit all the way down,  Engage the pelvic floor Push knees into red band,  Knees are not to go past the feet 10 times  1 time per day.      While in a crawling position, slowly raise up an arm out in front of you. Pull up pelvic floor,  brace abdominals 10 times each arm, 1 time per day.      Start in neutral quadruped position: arms under shoulders, knees under hips, chin tucked for neutral cervical spine, flat lumbar/thoracic spine (engage abdominals) Tighten the pelvic floor.   From a neutral quadruped position, extend one hip and knee backwards while maintaining spine in neutral position.  Control leg extension by engaging abdominals.  Come back to starting position.  Repeat with other leg.  10 times each leg, 1 time per day.     Lay flat on the floor with knees bent. Tighten core and glute  muscles and lift hips off the ground into a bridge position. While holding the bridge, flutter knees in and out (toward and away from each other). Flutter knees until time is up. Relax and go back to starting position. 10 time  1 time per day.   [facility] [address] [place], [place] [address] Phone # [phone] Fax [phone]

## 2017-08-01 ENCOUNTER — Ambulatory Visit: Payer: 59 | Attending: Family Medicine | Admitting: Physical Therapy

## 2017-08-01 ENCOUNTER — Encounter: Payer: Self-pay | Admitting: Physical Therapy

## 2017-08-01 DIAGNOSIS — R252 Cramp and spasm: Secondary | ICD-10-CM | POA: Diagnosis present

## 2017-08-01 DIAGNOSIS — M6281 Muscle weakness (generalized): Secondary | ICD-10-CM | POA: Diagnosis not present

## 2017-08-01 NOTE — Patient Instructions (Addendum)
Walk 30 min. Per day.   Diastasis Recti Correction With Towel (Hook-Lying)    Loop long towel or sheet under back and criss-cross over lower abdomen. Inhale. In one fluid movement: Pull in navel, pull towel tight across abdomen, exhale, and Hold for _5__ seconds. Return, rest for _5__ seconds. Repeat _10__ times. Do _2__ times a day.   Copyright  VHI. All rights reserved.  [facility] [address] [place],  [address] Phone # [phone] Fax [phone]

## 2017-08-01 NOTE — Therapy (Signed)
[facility] [address], [place], [place], [address] Phone: [phone]   Fax:  [phone]  Physical Therapy Treatment  Patient Details  Name: Amy Burton MRN: [id number] Date of Birth: [birth year] Referring Provider: Dr. Lyndal Pulley   Encounter Date: 08/01/2017  PT End of Session - 08/01/17 0935    Visit Number  7    Date for PT Re-Evaluation  09/19/17    Authorization Type  Cigna    Authorization - Visit Number  7    Authorization - Number of Visits  30    PT Start Time  [phone]    PT Stop Time  1013    PT Time Calculation (min)  39 min    Activity Tolerance  Patient tolerated treatment well    Behavior During Therapy  [facility] for tasks assessed/performed       Past Medical History:  Diagnosis Date  . Colon polyp   . Depression   . Thyroid disease     Past Surgical History:  Procedure Laterality Date  . BREAST SURGERY    . CESAREAN SECTION    . endomitriosis  1995    There were no vitals filed for this visit.  Subjective Assessment - 08/01/17 0936    Subjective  I have been looking into not eating the antihistamines.     Patient Stated Goals  reduce pain and improve strength    Currently in Pain?  No/denies                      [facility] Adult PT Treatment/Exercise - 08/01/17 0001      Neuro Re-ed    Neuro Re-ed Details   contraction of abdominals with engaging the lower abdominals with tactile cues and using a towel      Manual Therapy   Manual Therapy  Soft tissue mobilization    Myofascial Release  lumbar paraspinals bilateral       Trigger Point Dry Needling - 08/01/17 0959    Consent Given?  Yes    Muscles Treated Upper Body  -- lumbar multifid L1-L5    Muscles Treated Lower Body  -- twitch response           PT Education - 08/01/17 1013    Education provided  Yes    Education Details  walking program; abdominal contraction    Person(s) Educated  Patient    Methods  Explanation;Demonstration;Verbal cues;Handout;Tactile cues    Comprehension  Verbalized understanding;Returned demonstration;Verbal cues required;Tactile cues required;Need further instruction       PT Short Term Goals - 07/11/17 0901      PT SHORT TERM GOAL #1   Title  independent with initial HEP with flexibility exercises    Time  4    Period  Weeks    Status  Achieved        PT Long Term Goals - 08/01/17 7510      PT LONG TERM GOAL #1   Title  independent with HEP and understand how to progress herself    Baseline  still learning    Time  12    Period  Weeks    Status  On-going      PT LONG TERM GOAL #2   Title  falling out feeling in the vaginal areal decreased >/= 50% due to improve tone of abdominals and pelvic floor    Time  12    Period  Weeks  Status  Achieved      PT LONG TERM GOAL #3   Title  pain in the vaginal area decreased >/= 75% due to improved tissue mobility and tenderness in the vaginal canal    Time  12    Period  Weeks    Status  Achieved      PT LONG TERM GOAL #4   Title  straining with a bowel movement decreased >/= 50% due to improved bowel health and coordination of the pelvic floor muscles    Baseline  10% better; depends on the day and diet    Time  12    Period  Weeks    Status  On-going            Plan - 08/01/17 1014    Clinical Impression Statement  Patient has difficulty with contracting her lower abdominals and needed many tactile cues and using a sheet.  Patient is not having the falling out feeling.  Patient has tightness in er low back maiking difficult to exercise.  Trigger points in the lumbar multifidi and decreased after the dry needling.  Patient will benefit from skilled therapy to work on core and lower abdominal strength.     Rehab Potential  Excellent    Clinical Impairments Affecting Rehab Potential  s/p laproscopic surgery for endometriosis    PT Frequency  1x / week    PT Duration  12 weeks    PT  Treatment/Interventions  Biofeedback;Cryotherapy;Electrical Stimulation;Moist Heat;Ultrasound;Patient/family education;Neuromuscular re-education;Therapeutic exercise;Therapeutic activities;Manual techniques;Dry needling;Scar mobilization    PT Next Visit Plan  progress abdominal strength with marching; stretches; soft tissue work    PT Home Exercise Plan  progress as needed    Consulted and Agree with Plan of Care  Patient       Patient will benefit from skilled therapeutic intervention in order to improve the following deficits and impairments:  Pain, Decreased strength, Decreased activity tolerance, Increased fascial restricitons, Increased muscle spasms, Decreased scar mobility  Visit Diagnosis: Muscle weakness (generalized)  Cramp and spasm     Problem List Patient Active Problem List   Diagnosis Date Noted  . Nonallopathic lesion of thoracic region 05/15/2017  . Nonallopathic lesion of cervical region 05/15/2017  . Nonallopathic lesion of sacral region 05/15/2017  . Chronic neck pain 04/30/2017  . Chronic back pain 04/30/2017  . Arthralgia of hip 04/30/2017  . Pelvic prolapse 04/30/2017  . 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 Eczema 09/07/2016  . Urinary urgency 09/07/2016  . Chronic sinusitis 09/29/2015  . Allergic rhinitis 09/29/2015  . Hyperglycemia 09/29/2015  . Vitamin D deficiency 09/29/2015  . Situational anxiety-flying 09/29/2015  . ETD (Eustachian tube dysfunction), bilateral 09/29/2015    Earlie Counts, PT 08/01/17 10:17 AM   [facility] [address] [place], [place], [address] Phone: [phone]   Fax:  [phone]  Name: Amy Burton MRN: [id number] Date of Birth: December 16, 1960

## 2017-08-08 ENCOUNTER — Ambulatory Visit: Payer: 59 | Admitting: Physical Therapy

## 2017-08-08 ENCOUNTER — Encounter: Payer: Self-pay | Admitting: Physical Therapy

## 2017-08-08 DIAGNOSIS — M6281 Muscle weakness (generalized): Secondary | ICD-10-CM

## 2017-08-08 DIAGNOSIS — R252 Cramp and spasm: Secondary | ICD-10-CM

## 2017-08-08 NOTE — Therapy (Signed)
[facility] [address] [place], [place], [address] Phone: [phone]   Fax:  [phone]  Physical Therapy Treatment  Patient Details  Name: Amy Burton MRN: [id number] Date of Birth: 1961/06/12 Referring Provider: Dr. Lyndal Pulley   Encounter Date: 08/08/2017  PT End of Session - 08/08/17 1100    Visit Number  8    Date for PT Re-Evaluation  09/19/17    Authorization Type  C[employer]    Authorization - Visit Number  8    Authorization - Number of Visits  30    Activity Tolerance  Patient tolerated treatment well    Behavior During Therapy  [facility] for tasks assessed/performed       Past Medical History:  Diagnosis Date  . Colon polyp   . Depression   . Thyroid disease     Past Surgical History:  Procedure Laterality Date  . BREAST SURGERY    . CESAREAN SECTION    . endomitriosis  1995    There were no vitals filed for this visit.  Subjective Assessment - 08/08/17 1020    Subjective  I had a bad week and having trouble focusing.  I have been doing the exercises every day. I have a constant urge to have a bowel movement.  I had pain in my back with shoveling. One day last week I had sureness in the vaginal area.     Patient Stated Goals  reduce pain and improve strength    Currently in Pain?  No/denies                      [facility] Adult PT Treatment/Exercise - 08/08/17 0001      Manual Therapy   Manual Therapy  Soft tissue mobilization;Myofascial release    Soft tissue mobilization  release of the diaphgram, circular massage to promote intestinal mobility  to assist in bowel movement    Myofascial Release  release of the respiratory diaphgram and urogenital diaphgram going through the 3 planes of fascia               PT Short Term Goals - 07/11/17 0901      PT SHORT TERM GOAL #1   Title  independent with initial HEP with flexibility exercises    Time  4    Period  Weeks     Status  Achieved        PT Long Term Goals - 08/08/17 1023      PT LONG TERM GOAL #1   Title  independent with HEP and understand how to progress herself    Time  12    Period  Weeks    Status  On-going      PT LONG TERM GOAL #2   Title  falling out feeling in the vaginal areal decreased >/= 50% due to improve tone of abdominals and pelvic floor    Time  12    Period  Weeks    Status  Achieved      PT LONG TERM GOAL #3   Title  pain in the vaginal area decreased >/= 75% due to improved tissue mobility and tenderness in the vaginal canal    Time  12    Period  Weeks    Status  Achieved      PT LONG TERM GOAL #4   Title  straining with a bowel movement decreased >/= 50% due to improved bowel health and coordination  of the pelvic floor muscles    Baseline  depends on the day and diet    Time  12    Period  Weeks    Status  On-going            Plan - 08/08/17 1052    Clinical Impression Statement  Patient has been under alot of stress making her have difficulty with constipation and rumbling in her abdomen.  Patient had tightness in the upper mid abdomen and left upper abdomen but after therapy she had increased mobility. Patient is exercising consistently.  Patient has had one time with pain in the vaginal area.  Patient will benefit from skilled therapy to work on core and lower abdominal strength.     Rehab Potential  Excellent    PT Frequency  1x / week    PT Duration  12 weeks    PT Treatment/Interventions  Biofeedback;Cryotherapy;Electrical Stimulation;Moist Heat;Ultrasound;Patient/family education;Neuromuscular re-education;Therapeutic exercise;Therapeutic activities;Manual techniques;Dry needling;Scar mobilization    PT Next Visit Plan  progress abdominal strength with heel sides; stretches; soft tissue work; if has vaginal massage tool then educate on use    PT Home Exercise Plan  progress as needed    Consulted and Agree with Plan of Care  Patient        Patient will benefit from skilled therapeutic intervention in order to improve the following deficits and impairments:  Pain, Decreased strength, Decreased activity tolerance, Increased fascial restricitons, Increased muscle spasms, Decreased scar mobility  Visit Diagnosis: Muscle weakness (generalized)  Cramp and spasm     Problem List Patient Active Problem List   Diagnosis Date Noted  . Nonallopathic lesion of thoracic region 05/15/2017  . Nonallopathic lesion of cervical region 05/15/2017  . Nonallopathic lesion of sacral region 05/15/2017  . Chronic neck pain 04/30/2017  . Chronic back pain 04/30/2017  . Arthralgia of hip 04/30/2017  . Pelvic prolapse 04/30/2017  . 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 Eczema 09/07/2016  . Urinary urgency 09/07/2016  . Chronic sinusitis 09/29/2015  . Allergic rhinitis 09/29/2015  . Hyperglycemia 09/29/2015  . Vitamin D deficiency 09/29/2015  . Situational anxiety-flying 09/29/2015  . ETD (Eustachian tube dysfunction), bilateral 09/29/2015    Earlie Counts, PT 08/08/17 11:01 AM   [facility] [address], [place], [place], [address] Phone: [phone]   Fax:  [phone]  Name: Corrissa Martello MRN: [id number] Date of Birth: Jan 06, 1961

## 2017-08-14 ENCOUNTER — Encounter: Payer: PRIVATE HEALTH INSURANCE | Admitting: Physical Therapy

## 2017-08-14 NOTE — Progress Notes (Signed)
[facility] [place], [place] [address] Phone: [phone] Subjective:    I'm seeing this patient by the request  of:    CC: Back pain follow-up  PYP:[id number]  Amy Burton is a 56 y.o. female coming in with complaint of back pain.  Patient was seen previously and has been doing fairly well with working on the poor posture.  Patient has responded well to osteopathic manipulation.  Has change in ergonomics.  Was doing well but then had to help daughter move back into her house and did a lot of lifting which caused some increasing discomfort and pain.  Known moderate osteoarthritic changes of the neck.     Past Medical History:  Diagnosis Date  . Colon polyp   . Depression   . Thyroid disease    Past Surgical History:  Procedure Laterality Date  . BREAST SURGERY    . CESAREAN SECTION    . endomitriosis  1995   Social History   Socioeconomic History  . Marital status: Married    Spouse name: None  . Number of children: None  . Years of education: None  . Highest education level: None  Social Needs  . Financial resource strain: None  . Food insecurity - worry: None  . Food insecurity - inability: None  . Transportation needs - medical: None  . Transportation needs - non-medical: None  Occupational History  . None  Tobacco Use  . Smoking status: Never Smoker  . Smokeless tobacco: Never Used  Substance and Sexual Activity  . Alcohol use: Yes  . Drug use: No  . Sexual activity: None  Other Topics Concern  . None  Social History Narrative  . None   Allergies  Allergen Reactions  . Sulfa Antibiotics    Family History  Problem Relation Age of Onset  . Hyperlipidemia Mother   . Hypertension Mother   . Diabetes Father   . Arthritis Sister   . Heart disease Brother   . Heart attack Brother   . Cancer Maternal Grandmother        breast     Past medical history, social, surgical and family history all  reviewed in electronic medical record.  No pertanent information unless stated regarding to the chief complaint.   Review of Systems:Review of systems updated and as accurate as of 08/15/17  No headache, visual changes, nausea, vomiting, diarrhea, constipation, dizziness, abdominal pain, skin rash, fevers, chills, night sweats, weight loss, swollen lymph nodes, body aches, joint swelling, , chest pain, shortness of breath, mood changes.  Positive muscle aches  Objective  Blood pressure 126/90, pulse 70, height 5\' 4"  (1.626 m), weight 175 lb (79.4 kg), SpO2 96 %. Systems examined below as of 08/15/17   General: No apparent distress alert and oriented x3 mood and affect normal, dressed appropriately.  HEENT: Pupils equal, extraocular movements intact  Respiratory: Patient's speak in full sentences and does not appear short of breath  Cardiovascular: No lower extremity edema, non tender, no erythema  Skin: Warm dry intact with no signs of infection or rash on extremities or on axial skeleton.  Abdomen: Soft nontender  Neuro: Cranial nerves II through XII are intact, neurovascularly intact in all extremities with 2+ DTRs and 2+ pulses.  Lymph: No lymphadenopathy of posterior or anterior cervical chain or axillae bilaterally.  Gait normal with good balance and coordination.  MSK:  Non tender with full range of motion and good stability and symmetric strength  and tone of shoulders, elbows, wrist, hip, knee and ankles bilaterally.  Patient scapula does still show some mild dyskinesis on the right side for him on the right side with some increasing discomfort.  Patient neck still has some mild limitation in range of motion lacking the last 5-10 degrees of extension.  No crepitus noted.  Negative Spurling's.  Neurovascularly intact in all extremities.  Osteopathic findings  C6 flexed rotated and side bent right T5 extended rotated and side bent right inhaled rib T9 extended rotated and side bent  left L2 flexed rotated and side bent right Sacrum right on right     Impression and Recommendations:     This case required medical decision making of moderate complexity.      Note: This dictation was prepared with Dragon dictation along with smaller phrase technology. Any transcriptional errors that result from this process are unintentional.

## 2017-08-15 ENCOUNTER — Ambulatory Visit (INDEPENDENT_AMBULATORY_CARE_PROVIDER_SITE_OTHER): Payer: PRIVATE HEALTH INSURANCE | Admitting: Family Medicine

## 2017-08-15 ENCOUNTER — Encounter: Payer: Self-pay | Admitting: Family Medicine

## 2017-08-15 VITALS — BP 126/90 | HR 70 | Ht 64.0 in | Wt 175.0 lb

## 2017-08-15 DIAGNOSIS — G8929 Other chronic pain: Secondary | ICD-10-CM | POA: Diagnosis not present

## 2017-08-15 DIAGNOSIS — M999 Biomechanical lesion, unspecified: Secondary | ICD-10-CM

## 2017-08-15 DIAGNOSIS — M542 Cervicalgia: Secondary | ICD-10-CM | POA: Diagnosis not present

## 2017-08-15 NOTE — Assessment & Plan Note (Signed)
Decision today to treat with OMT was based on Physical Exam  After verbal consent patient was treated with HVLA, ME, FPR techniques in cervical, thoracic, rib,  lumbar and sacral areas  Patient tolerated the procedure well with improvement in symptoms  Patient given exercises, stretches and lifestyle modifications  See medications in patient instructions if given  Patient will follow up in 4-8 weeks 

## 2017-08-15 NOTE — Patient Instructions (Signed)
You are doing amazing  Alvera Singh is your friend.  Stay active.  See me again in 2 months Happy New Year!

## 2017-08-15 NOTE — Assessment & Plan Note (Signed)
Underlying arthritis.  Doing well.  Patient has been doing very well with osteopathic manipulation. HEP encouraged.  RTC in 4-8  weeks.

## 2017-08-16 ENCOUNTER — Telehealth: Payer: Self-pay

## 2017-08-16 NOTE — Telephone Encounter (Signed)
Spoke with patient to reschedule appointment for NGYN ?vaginal prolapse. Dr.Silva out of office due to family emergency. Patient states she has begun pelvic floor physical therapy and feeling much better and wants to wait and see Dr.Silva. Placed on waitlist for her return.

## 2017-08-22 ENCOUNTER — Telehealth: Payer: Self-pay | Admitting: Physical Therapy

## 2017-08-22 ENCOUNTER — Ambulatory Visit: Payer: 59 | Admitting: Physical Therapy

## 2017-08-22 NOTE — Telephone Encounter (Signed)
Called patient and she was taking care of her grandchild and forgot about the appointment.  Earlie Counts, PT @12 /27/2018@ 12:34 PM

## 2017-08-28 ENCOUNTER — Encounter: Payer: PRIVATE HEALTH INSURANCE | Admitting: Obstetrics and Gynecology

## 2017-08-29 ENCOUNTER — Ambulatory Visit: Payer: 59 | Admitting: Physical Therapy

## 2017-09-05 ENCOUNTER — Ambulatory Visit: Payer: 59 | Attending: Family Medicine | Admitting: Physical Therapy

## 2017-09-05 ENCOUNTER — Encounter: Payer: Self-pay | Admitting: Physical Therapy

## 2017-09-05 DIAGNOSIS — R252 Cramp and spasm: Secondary | ICD-10-CM | POA: Diagnosis present

## 2017-09-05 DIAGNOSIS — M6281 Muscle weakness (generalized): Secondary | ICD-10-CM | POA: Insufficient documentation

## 2017-09-05 NOTE — Therapy (Signed)
[facility] [address] [place], [place], [address] Phone: [phone]   Fax:  [phone]  Physical Therapy Treatment  Patient Details  Name: Amy Burton MRN: [id number] Date of Birth: 1960/10/06 Referring Provider: Dr. Lyndal Pulley   Encounter Date: 09/05/2017  PT End of Session - 09/05/17 0927    Visit Number  9    Date for PT Re-Evaluation  09/19/17    Authorization Type  Cigna    Authorization - Visit Number  9    Authorization - Number of Visits  30    PT Start Time  0845    PT Stop Time  0927    PT Time Calculation (min)  42 min    Activity Tolerance  Patient tolerated treatment well    Behavior During Therapy  [facility] for tasks assessed/performed       Past Medical History:  Diagnosis Date  . Colon polyp   . Depression   . Thyroid disease     Past Surgical History:  Procedure Laterality Date  . BREAST SURGERY    . CESAREAN SECTION    . endomitriosis  1995    There were no vitals filed for this visit.  Subjective Assessment - 09/05/17 0849    Subjective  I did not do the exericses for a few days and felt the pressure. My back is feeling better.     Patient Stated Goals  reduce pain and improve strength    Currently in Pain?  No/denies                   Pelvic Floor Special Questions - 09/05/17 0001    Pelvic Floor Internal Exam  patient confims identification and approves PT to assess pelvic floor muscle integrity    Exam Type  Vaginal        [facility] Adult PT Treatment/Exercise - 09/05/17 0001      Manual Therapy   Manual Therapy  Internal Pelvic Floor    Manual therapy comments  educated patient on how to use her wand for pelvic floor massage to work the trigger points in the pelvic floor muscles in reclined position and patient was able to     Internal Pelvic Floor  left coccygeus, right ishiococcygeus             PT Education - 09/05/17 0926    Education  provided  Yes    Education Details  you tube video on pelvic floor massage with wand; gave pain samples of slippery stuff; information on lubricants    Person(s) Educated  Patient    Methods  Explanation;Demonstration;Verbal cues;Handout    Comprehension  Returned demonstration;Verbalized understanding       PT Short Term Goals - 07/11/17 0901      PT SHORT TERM GOAL #1   Title  independent with initial HEP with flexibility exercises    Time  4    Period  Weeks    Status  Achieved        PT Long Term Goals - 09/05/17 6073      PT LONG TERM GOAL #1   Title  independent with HEP and understand how to progress herself    Baseline  still learning    Time  12    Period  Weeks    Status  On-going      PT LONG TERM GOAL #2   Title  falling out feeling in the vaginal areal decreased >/=  50% due to improve tone of abdominals and pelvic floor    Time  12    Period  Weeks    Status  Achieved      PT LONG TERM GOAL #3   Title  pain in the vaginal area decreased >/= 75% due to improved tissue mobility and tenderness in the vaginal canal    Time  12    Period  Weeks    Status  Achieved      PT LONG TERM GOAL #4   Title  straining with a bowel movement decreased >/= 50% due to improved bowel health and coordination of the pelvic floor muscles    Baseline  depends on the day and diet    Time  12    Period  Weeks    Status  On-going            Plan - 09/05/17 6503    Clinical Impression Statement  Patient only has falling out feeling when she is not doing her exercise. Patient understands how to perform internal soft tissue work with her wand to work on pelvic floor trigger points.  Patient will benefit from skilled therapy to work on core and lower abdominal strength.     Rehab Potential  Excellent    Clinical Impairments Affecting Rehab Potential  s/p laproscopic surgery for endometriosis    PT Frequency  1x / week    PT Duration  12 weeks    PT Treatment/Interventions   Biofeedback;Cryotherapy;Electrical Stimulation;Moist Heat;Ultrasound;Patient/family education;Neuromuscular re-education;Therapeutic exercise;Therapeutic activities;Manual techniques;Dry needling;Scar mobilization    PT Next Visit Plan  possible discharge; review HEP    PT Home Exercise Plan  progress as needed    Consulted and Agree with Plan of Care  Patient       Patient will benefit from skilled therapeutic intervention in order to improve the following deficits and impairments:  Pain, Decreased strength, Decreased activity tolerance, Increased fascial restricitons, Increased muscle spasms, Decreased scar mobility  Visit Diagnosis: Muscle weakness (generalized)  Cramp and spasm     Problem List Patient Active Problem List   Diagnosis Date Noted  . Nonallopathic lesion of thoracic region 05/15/2017  . Nonallopathic lesion of cervical region 05/15/2017  . Nonallopathic lesion of sacral region 05/15/2017  . Chronic neck pain 04/30/2017  . Chronic back pain 04/30/2017  . Arthralgia of hip 04/30/2017  . Pelvic prolapse 04/30/2017  . 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 Eczema 09/07/2016  . Urinary urgency 09/07/2016  . Chronic sinusitis 09/29/2015  . Allergic rhinitis 09/29/2015  . Hyperglycemia 09/29/2015  . Vitamin D deficiency 09/29/2015  . Situational anxiety-flying 09/29/2015  . ETD (Eustachian tube dysfunction), bilateral 09/29/2015    Earlie Counts, PT 09/05/17 9:30 AM   [facility] [address] [place], [place], [address] Phone: [phone]   Fax:  [phone]  Name: Karliah Kowalchuk MRN: [id number] Date of Birth: April 22, 1961

## 2017-09-05 NOTE — Patient Instructions (Addendum)
  [email or web address]  Relieve pelvic pain using a wand. it's almost magic!   Jilly Bond   Perform soft tissue work to the pelvic floor every other day. If you start noticing no trigger points  Then can do every other week.   Lubrication . Used for intercourse to reduce friction . Avoid ones that have glycerin, warming gels, tingling gels, icing or cooling gel, scented . Avoid parabens due to a preservative similar to female sex hormone . May need to be reapplied once or several times during sexual activity . Can be applied to both partners genitals prior to vaginal penetration to minimize friction or irritation . Prevent irritation and mucosal tears that cause post coital pain and increased the risk of vaginal and urinary tract infections . Oil-based lubricants cannot be used with condoms due to breaking them down.  Least likely to irritate vaginal tissue.  . Plant based-lubes are safe . Silicone-based lubrication are thicker and last long and used for post-menopausal women  Vaginal Lubricators Here is a list of some suggested lubricators you can use for intercourse. Use the most hypoallergenic product.  You can place on you or your partner.   Slippery Stuff  Sylk or Sliquid Natural H2O ( good  if frequent UTI's)  Blossom Organics ([email or web address]-organics.com)  Luvena   Coconut oil  PJur Woman Nude- water based lubricant, amazon  Uberlube- Amazon  Aloe Vera  Yes lubricant- [employer] Platinum-Silicone, [employer], [facility]  Olive and Bee intimate cream-  www.oliveandbee.com.au Things to avoid in lubricants are glycerin, warming gels, tingling gels, icing or cooling  gels, and scented gels.  Also avoid Vaseline. KY jelly, Replens, and Astroglide kills good bacteria(lactobacilli)  Things to avoid in the vaginal area . Do not use things to irritate the vulvar area . No lotions- see below . No soaps; can use Aveeno or Calendula cleanser if needed. Must be gentle . No  deodorants . No douches . Good to sleep without underwear to let the vaginal area to air out . No scrubbing: spread the lips to let warm water rinse over labias and pat dry  Creams that can be used on the [place] [employer] or [facility] [address] [place], [place] [address] Phone # [phone] Fax [phone]

## 2017-09-10 NOTE — Patient Instructions (Addendum)
Test(s) ordered today. Your results will be released to MyChart (or called to you) after review, usually within 72hours after test completion. If any changes need to be made, you will be notified at that same time.  All other Health Maintenance issues reviewed.   All recommended immunizations and age-appropriate screenings are up-to-date or discussed.  No immunizations administered today.   Medications reviewed and updated.  No changes recommended at this time.    Please followup in one year   Health Maintenance, Female Adopting a healthy lifestyle and getting preventive care can go a long way to promote health and wellness. Talk with your health care provider about what schedule of regular examinations is right for you. This is a good chance for you to check in with your provider about disease prevention and staying healthy. In between checkups, there are plenty of things you can do on your own. Experts have done a lot of research about which lifestyle changes and preventive measures are most likely to keep you healthy. Ask your health care provider for more information. Weight and diet Eat a healthy diet  Be sure to include plenty of vegetables, fruits, low-fat dairy products, and lean protein.  Do not eat a lot of foods high in solid fats, added sugars, or salt.  Get regular exercise. This is one of the most important things you can do for your health. ? Most adults should exercise for at least 150 minutes each week. The exercise should increase your heart rate and make you sweat (moderate-intensity exercise). ? Most adults should also do strengthening exercises at least twice a week. This is in addition to the moderate-intensity exercise.  Maintain a healthy weight  Body mass index (BMI) is a measurement that can be used to identify possible weight problems. It estimates body fat based on height and weight. Your health care provider can help determine your BMI and help you achieve  or maintain a healthy weight.  For females 20 years of age and older: ? A BMI below 18.5 is considered underweight. ? A BMI of 18.5 to 24.9 is normal. ? A BMI of 25 to 29.9 is considered overweight. ? A BMI of 30 and above is considered obese.  Watch levels of cholesterol and blood lipids  You should start having your blood tested for lipids and cholesterol at 58 years of age, then have this test every 5 years.  You may need to have your cholesterol levels checked more often if: ? Your lipid or cholesterol levels are high. ? You are older than 58 years of age. ? You are at high risk for heart disease.  Cancer screening Lung Cancer  Lung cancer screening is recommended for adults 55-80 years old who are at high risk for lung cancer because of a history of smoking.  A yearly low-dose CT scan of the lungs is recommended for people who: ? Currently smoke. ? Have quit within the past 15 years. ? Have at least a 30-pack-year history of smoking. A pack year is smoking an average of one pack of cigarettes a day for 1 year.  Yearly screening should continue until it has been 15 years since you quit.  Yearly screening should stop if you develop a health problem that would prevent you from having lung cancer treatment.  Breast Cancer  Practice breast self-awareness. This means understanding how your breasts normally appear and feel.  It also means doing regular breast self-exams. Let your health care provider know about any   changes, no matter how small.  If you are in your 20s or 30s, you should have a clinical breast exam (CBE) by a health care provider every 1-3 years as part of a regular health exam.  If you are 40 or older, have a CBE every year. Also consider having a breast X-ray (mammogram) every year.  If you have a family history of breast cancer, talk to your health care provider about genetic screening.  If you are at high risk for breast cancer, talk to your health care  provider about having an MRI and a mammogram every year.  Breast cancer gene (BRCA) assessment is recommended for women who have family members with BRCA-related cancers. BRCA-related cancers include: ? Breast. ? Ovarian. ? Tubal. ? Peritoneal cancers.  Results of the assessment will determine the need for genetic counseling and BRCA1 and BRCA2 testing.  Cervical Cancer Your health care provider may recommend that you be screened regularly for cancer of the pelvic organs (ovaries, uterus, and vagina). This screening involves a pelvic examination, including checking for microscopic changes to the surface of your cervix (Pap test). You may be encouraged to have this screening done every 3 years, beginning at age 21.  For women ages 30-65, health care providers may recommend pelvic exams and Pap testing every 3 years, or they may recommend the Pap and pelvic exam, combined with testing for human papilloma virus (HPV), every 5 years. Some types of HPV increase your risk of cervical cancer. Testing for HPV may also be done on women of any age with unclear Pap test results.  Other health care providers may not recommend any screening for nonpregnant women who are considered low risk for pelvic cancer and who do not have symptoms. Ask your health care provider if a screening pelvic exam is right for you.  If you have had past treatment for cervical cancer or a condition that could lead to cancer, you need Pap tests and screening for cancer for at least 20 years after your treatment. If Pap tests have been discontinued, your risk factors (such as having a new sexual partner) need to be reassessed to determine if screening should resume. Some women have medical problems that increase the chance of getting cervical cancer. In these cases, your health care provider may recommend more frequent screening and Pap tests.  Colorectal Cancer  This type of cancer can be detected and often prevented.  Routine  colorectal cancer screening usually begins at 58 years of age and continues through 58 years of age.  Your health care provider may recommend screening at an earlier age if you have risk factors for colon cancer.  Your health care provider may also recommend using home test kits to check for hidden blood in the stool.  A small camera at the end of a tube can be used to examine your colon directly (sigmoidoscopy or colonoscopy). This is done to check for the earliest forms of colorectal cancer.  Routine screening usually begins at age 50.  Direct examination of the colon should be repeated every 5-10 years through 58 years of age. However, you may need to be screened more often if early forms of precancerous polyps or small growths are found.  Skin Cancer  Check your skin from head to toe regularly.  Tell your health care provider about any new moles or changes in moles, especially if there is a change in a mole's shape or color.  Also tell your health care provider if   you have a mole that is larger than the size of a pencil eraser.  Always use sunscreen. Apply sunscreen liberally and repeatedly throughout the day.  Protect yourself by wearing long sleeves, pants, a wide-brimmed hat, and sunglasses whenever you are outside.  Heart disease, diabetes, and high blood pressure  High blood pressure causes heart disease and increases the risk of stroke. High blood pressure is more likely to develop in: ? People who have blood pressure in the high end of the normal range (130-139/85-89 mm Hg). ? People who are overweight or obese. ? People who are African American.  If you are 55-1 years of age, have your blood pressure checked every 3-5 years. If you are 86 years of age or older, have your blood pressure checked every year. You should have your blood pressure measured twice-once when you are at a hospital or clinic, and once when you are not at a hospital or clinic. Record the average of the  two measurements. To check your blood pressure when you are not at a hospital or clinic, you can use: ? An automated blood pressure machine at a pharmacy. ? A home blood pressure monitor.  If you are between 15 years and 81 years old, ask your health care provider if you should take aspirin to prevent strokes.  Have regular diabetes screenings. This involves taking a blood sample to check your fasting blood sugar level. ? If you are at a normal weight and have a low risk for diabetes, have this test once every three years after 58 years of age. ? If you are overweight and have a high risk for diabetes, consider being tested at a younger age or more often. Preventing infection Hepatitis B  If you have a higher risk for hepatitis B, you should be screened for this virus. You are considered at high risk for hepatitis B if: ? You were born in a country where hepatitis B is common. Ask your health care provider which countries are considered high risk. ? Your parents were born in a high-risk country, and you have not been immunized against hepatitis B (hepatitis B vaccine). ? You have HIV or AIDS. ? You use needles to inject street drugs. ? You live with someone who has hepatitis B. ? You have had sex with someone who has hepatitis B. ? You get hemodialysis treatment. ? You take certain medicines for conditions, including cancer, organ transplantation, and autoimmune conditions.  Hepatitis C  Blood testing is recommended for: ? Everyone born from [birth year] through 1966. ? Anyone with known risk factors for hepatitis C.  Sexually transmitted infections (STIs)  You should be screened for sexually transmitted infections (STIs) including gonorrhea and chlamydia if: ? You are sexually active and are younger than 58 years of age. ? You are older than 58 years of age and your health care provider tells you that you are at risk for this type of infection. ? Your sexual activity has changed since you  were last screened and you are at an increased risk for chlamydia or gonorrhea. Ask your health care provider if you are at risk.  If you do not have HIV, but are at risk, it may be recommended that you take a prescription medicine daily to prevent HIV infection. This is called pre-exposure prophylaxis (PrEP). You are considered at risk if: ? You are sexually active and do not regularly use condoms or know the HIV status of your partner(s). ? You take drugs by  injection. ? You are sexually active with a partner who has HIV.  Talk with your health care provider about whether you are at high risk of being infected with HIV. If you choose to begin PrEP, you should first be tested for HIV. You should then be tested every 3 months for as long as you are taking PrEP. Pregnancy  If you are premenopausal and you may become pregnant, ask your health care provider about preconception counseling.  If you may become pregnant, take 400 to 800 micrograms (mcg) of folic acid every day.  If you want to prevent pregnancy, talk to your health care provider about birth control (contraception). Osteoporosis and menopause  Osteoporosis is a disease in which the bones lose minerals and strength with aging. This can result in serious bone fractures. Your risk for osteoporosis can be identified using a bone density scan.  If you are 69 years of age or older, or if you are at risk for osteoporosis and fractures, ask your health care provider if you should be screened.  Ask your health care provider whether you should take a calcium or vitamin D supplement to lower your risk for osteoporosis.  Menopause may have certain physical symptoms and risks.  Hormone replacement therapy may reduce some of these symptoms and risks. Talk to your health care provider about whether hormone replacement therapy is right for you. Follow these instructions at home:  Schedule regular health, dental, and eye exams.  Stay current  with your immunizations.  Do not use any tobacco products including cigarettes, chewing tobacco, or electronic cigarettes.  If you are pregnant, do not drink alcohol.  If you are breastfeeding, limit how much and how often you drink alcohol.  Limit alcohol intake to no more than 1 drink per day for nonpregnant women. One drink equals 12 ounces of beer, 5 ounces of wine, or 1 ounces of hard liquor.  Do not use street drugs.  Do not share needles.  Ask your health care provider for help if you need support or information about quitting drugs.  Tell your health care provider if you often feel depressed.  Tell your health care provider if you have ever been abused or do not feel safe at home. This information is not intended to replace advice given to you by your health care provider. Make sure you discuss any questions you have with your health care provider. Document Released: 02/26/2011 Document Revised: 01/19/2016 Document Reviewed: 05/17/2015 Elsevier Interactive Patient Education  [employer].

## 2017-09-10 NOTE — Progress Notes (Signed)
Subjective:    Patient ID: Amy Burton, female    DOB: 1961/02/20, 57 y.o.   MRN: [id number]  HPI She is here for a physical exam.   She has been exposed to mold and it was a toxic type of mold.    She is still following with a naturalist and is on a number of supplements.  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     Medications and allergies reviewed with patient and updated if appropriate.  Patient Active Problem List   Diagnosis Date Noted  . Nonallopathic lesion of thoracic region 05/15/2017  . Nonallopathic lesion of cervical region 05/15/2017  . Nonallopathic lesion of sacral region 05/15/2017  . Chronic neck pain 04/30/2017  . Chronic back pain 04/30/2017  . Arthralgia of hip 04/30/2017  . Pelvic prolapse 04/30/2017  . 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 Eczema 09/07/2016  . Urinary urgency 09/07/2016  . Chronic sinusitis 09/29/2015  . Allergic rhinitis 09/29/2015  . Prediabetes 09/29/2015  . Vitamin D deficiency 09/29/2015  . Situational anxiety-flying 09/29/2015  . ETD (Eustachian tube dysfunction), bilateral 09/29/2015    Current Outpatient Medications on File Prior to Visit  Medication Sig Dispense Refill  . ALPRAZolam (XANAX) 0.25 MG tablet Take 1 tablet (0.25 mg total) by mouth daily as needed for anxiety (For Dental work and traveling). 30 tablet 0  . Ascorbic Acid (VITAMIN C PO) Take by mouth daily.    . Ascorbic Acid (VITAMIN C) 1000 MG tablet Take 1,000 mg by mouth daily.    . B Complex Vitamins (VITAMIN B COMPLEX PO) Take by mouth.    . cetirizine (ZYRTEC) 10 MG tablet Take 1 tablet (10 mg total) by mouth daily. 30 tablet 11  . Cyanocobalamin (VITAMIN B-12 PO) Take 5,000 mg by mouth daily.     Marland Kitchen ibuprofen (ADVIL,MOTRIN) 600 MG tablet Take 600 mg by mouth every 6 (six) hours as needed.    Marland Kitchen OVER THE COUNTER MEDICATION Take 2 tablets by mouth daily.    . Probiotic Product (PROBIOTIC PO)  Take by mouth.    . TURMERIC PO Take by mouth daily.     No current facility-administered medications on file prior to visit.     Past Medical History:  Diagnosis Date  . Colon polyp   . Depression   . Thyroid disease     Past Surgical History:  Procedure Laterality Date  . BREAST SURGERY    . CESAREAN SECTION    . endomitriosis  1995    Social History   Socioeconomic History  . Marital status: Married    Spouse name: None  . Number of children: None  . Years of education: None  . Highest education level: None  Social Needs  . Financial resource strain: None  . Food insecurity - worry: None  . Food insecurity - inability: None  . Transportation needs - medical: None  . Transportation needs - non-medical: None  Occupational History  . None  Tobacco Use  . Smoking status: Never Smoker  . Smokeless tobacco: Never Used  Substance and Sexual Activity  . Alcohol use: Yes  . Drug use: No  . Sexual activity: None  Other Topics Concern  . None  Social History Narrative  . None    Family History  Problem Relation Age of Onset  . Hyperlipidemia Mother   . Hypertension Mother   . Diabetes Father   . Arthritis Sister   .  Heart disease Brother   . Heart attack Brother   . Cancer Maternal Grandmother        breast    Review of Systems  Constitutional: Negative for chills and fever.       Hot flashes have come back over the past year with increased stress  Eyes: Negative for visual disturbance.  Respiratory: Negative for cough, shortness of breath and wheezing.   Cardiovascular: Positive for chest pain (only related to gas). Negative for palpitations (occ heart racing with extreme stress or overexertion and hot flash) and leg swelling.  Gastrointestinal: Positive for abdominal distention (with IBS), abdominal pain (with IBS), anal bleeding and constipation. Negative for blood in stool and diarrhea.  Genitourinary: Negative for dysuria and hematuria.    Musculoskeletal: Positive for arthralgias (thumbs) and back pain (intermittent, chronic lower back pain - improved with pelvic therapy).  Skin: Negative for color change and rash.  Neurological: Positive for headaches (from neck ).  Psychiatric/Behavioral: Positive for dysphoric mood. The patient is nervous/anxious.        Objective:   Vitals:   09/11/17 0837  BP: 120/80  Pulse: 72  Resp: 16  Temp: 98.1 F (36.7 C)  SpO2: 98%   Filed Weights   09/11/17 0837  Weight: 174 lb (78.9 kg)   Body mass index is 29.87 kg/m.  Wt Readings from Last 3 Encounters:  09/11/17 174 lb (78.9 kg)  08/15/17 175 lb (79.4 kg)  07/10/17 172 lb (78 kg)     Physical Exam Constitutional: She appears well-developed and well-nourished. No distress.  HENT:  Head: Normocephalic and atraumatic.  Right Ear: External ear normal. Normal ear canal and TM Left Ear: External ear normal.  Normal ear canal and TM Mouth/Throat: Oropharynx is clear and moist.  Eyes: Conjunctivae and EOM are normal.  Neck: Neck supple. No tracheal deviation present. No thyromegaly present.  No carotid bruit  Cardiovascular: Normal rate, regular rhythm and normal heart sounds.   No murmur heard.  No edema. Pulmonary/Chest: Effort normal and breath sounds normal. No respiratory distress. She has no wheezes. She has no rales.  Breast: deferred to Gyn Abdominal: Soft. She exhibits no distension. There is no tenderness.  Lymphadenopathy: She has no cervical adenopathy.  Skin: Skin is warm and dry. She is not diaphoretic.  Psychiatric: She has a normal mood and affect. Her behavior is normal.        Assessment & Plan:   Physical exam: Screening blood work ordered Immunizations  Flu vaccine due, shingrix discussed, other up to date Colonoscopy  Up to date  Mammogram - thermography done 03/2017 Gyn  Up to date  Eye exams   Up to date  EKG  Done 09/2016 Exercise  - walking some, but very active Weight - work on weight  loss Skin no concerns Substance abuse  none  See Problem List for Assessment and Plan of chronic medical problems.   FU in one year

## 2017-09-11 ENCOUNTER — Ambulatory Visit (INDEPENDENT_AMBULATORY_CARE_PROVIDER_SITE_OTHER): Payer: PRIVATE HEALTH INSURANCE | Admitting: Internal Medicine

## 2017-09-11 ENCOUNTER — Encounter: Payer: Self-pay | Admitting: Internal Medicine

## 2017-09-11 ENCOUNTER — Other Ambulatory Visit (INDEPENDENT_AMBULATORY_CARE_PROVIDER_SITE_OTHER): Payer: PRIVATE HEALTH INSURANCE

## 2017-09-11 VITALS — BP 120/80 | HR 72 | Temp 98.1°F | Resp 16 | Ht 64.0 in | Wt 174.0 lb

## 2017-09-11 DIAGNOSIS — Z0000 Encounter for general adult medical examination without abnormal findings: Secondary | ICD-10-CM

## 2017-09-11 DIAGNOSIS — R7303 Prediabetes: Secondary | ICD-10-CM | POA: Diagnosis not present

## 2017-09-11 LAB — CBC WITH DIFFERENTIAL/PLATELET
Basophils Absolute: 0 10*3/uL (ref 0.0–0.1)
Basophils Relative: 0.6 % (ref 0.0–3.0)
Eosinophils Absolute: 0.1 10*3/uL (ref 0.0–0.7)
Eosinophils Relative: 2.9 % (ref 0.0–5.0)
HEMATOCRIT: 41.2 % (ref 36.0–46.0)
HEMOGLOBIN: 13.7 g/dL (ref 12.0–15.0)
LYMPHS PCT: 17.8 % (ref 12.0–46.0)
Lymphs Abs: 0.9 10*3/uL (ref 0.7–4.0)
MCHC: 33.1 g/dL (ref 30.0–36.0)
MCV: 86.6 fl (ref 78.0–100.0)
MONO ABS: 0.3 10*3/uL (ref 0.1–1.0)
Monocytes Relative: 7.2 % (ref 3.0–12.0)
Neutro Abs: 3.5 10*3/uL (ref 1.4–7.7)
Neutrophils Relative %: 71.5 % (ref 43.0–77.0)
Platelets: 242 10*3/uL (ref 150.0–400.0)
RBC: 4.76 Mil/uL (ref 3.87–5.11)
RDW: 13.4 % (ref 11.5–15.5)
WBC: 4.9 10*3/uL (ref 4.0–10.5)

## 2017-09-11 LAB — COMPREHENSIVE METABOLIC PANEL
ALBUMIN: 4.5 g/dL (ref 3.5–5.2)
ALK PHOS: 72 U/L (ref 39–117)
ALT: 12 U/L (ref 0–35)
AST: 16 U/L (ref 0–37)
BUN: 15 mg/dL (ref 6–23)
CALCIUM: 10 mg/dL (ref 8.4–10.5)
CHLORIDE: 107 meq/L (ref 96–112)
CO2: 27 mEq/L (ref 19–32)
Creatinine, Ser: 0.67 mg/dL (ref 0.40–1.20)
GFR: 96.16 mL/min (ref >60.00)
Glucose, Bld: 99 mg/dL (ref 70–99)
Potassium: 4.1 mEq/L (ref 3.5–5.1)
Sodium: 140 mEq/L (ref 135–145)
TOTAL PROTEIN: 7.2 g/dL (ref 6.0–8.3)
Total Bilirubin: 0.7 mg/dL (ref 0.2–1.2)

## 2017-09-11 LAB — LIPID PANEL
Cholesterol: 206 mg/dL — ABNORMAL HIGH (ref 0–200)
HDL: 56.8 mg/dL (ref >39.00)
LDL CALC: 123 mg/dL — AB (ref 0–99)
NonHDL: 149.03
TRIGLYCERIDES: 132 mg/dL (ref 0.0–149.0)
Total CHOL/HDL Ratio: 4
VLDL: 26.4 mg/dL (ref 0.0–40.0)

## 2017-09-11 LAB — TSH: TSH: 3.14 u[IU]/mL (ref 0.35–4.50)

## 2017-09-11 LAB — HEMOGLOBIN A1C: HEMOGLOBIN A1C: 5.9 % (ref 4.6–6.5)

## 2017-09-11 NOTE — Assessment & Plan Note (Signed)
Check a1c Low sugar / carb diet Stressed regular exercise   

## 2017-09-12 ENCOUNTER — Telehealth: Payer: Self-pay | Admitting: Internal Medicine

## 2017-09-12 ENCOUNTER — Ambulatory Visit: Payer: 59 | Admitting: Physical Therapy

## 2017-09-12 ENCOUNTER — Encounter: Payer: Self-pay | Admitting: Physical Therapy

## 2017-09-12 ENCOUNTER — Encounter: Payer: Self-pay | Admitting: Internal Medicine

## 2017-09-12 DIAGNOSIS — M6281 Muscle weakness (generalized): Secondary | ICD-10-CM | POA: Diagnosis not present

## 2017-09-12 DIAGNOSIS — R252 Cramp and spasm: Secondary | ICD-10-CM

## 2017-09-12 NOTE — Telephone Encounter (Signed)
Patient has dropped off Preventive Care Confirmation form.  Needs formed completed and would like faxed back to number on sheet.  Patient would like a call once complete.  Will place in [place] box.

## 2017-09-12 NOTE — Patient Instructions (Signed)
Lower abdominal/core stability exercises  1. Practice your breathing technique: Inhale through your nose expanding your belly and rib cage. Try not to breathe into your chest. Exhale slowly and gradually out your mouth feeling a sense of softness to your body. Practice multiple times. This can be performed unlimited.  2. Finding the lower abdominals. Laying on your back with the knees bent, place your fingers just below your belly button. Using your breathing technique from above, on your exhale gently pull the belly button away from your fingertips without tensing any other muscles. Practice this 5x. Next, as you exhale, draw belly button inwards and hold onto it...then feel as if you are pulling that muscle across your pelvis like you are tightening a belt. This can be hard to do at first so be patient and practice. Do 5-10 reps 1-3 x day. Always recognize quality over quantity; if your abdominal muscles become tired you will notice you may tighten/contract other muscles. This is the time to take a break.   Practice this first laying on your back, then in sitting, progressing to standing and finally adding it to all your daily movements.   3. Finding your pelvic floor. Using the breathing technique above, when your exhale, this time draw your pelvic floor muscles up as if you were attempting to stop the flow of urination. Be careful NOT to tense any other muscles. This can be hard, BE PATIENT. Try to hold up to 10 seconds repeating 10x. Try 2x a day. Once you feel you are doing this well, add this contraction to exercise #2. First contracting your pelvic floor followed by lower abdominals.   4. Adding leg movements. Add the following leg movements to challenge your ability to keep your core stable:  1. Single leg drop outs: Laying on your back with knees bent feet flat. Inhale,  dropping one knee outward KEEPING YOUR PELVIS STILL. Exhale as you bring the leg back, simultaneously performing your lower  abdominal contraction. Do 5-10 on each leg.   2. Marching: While keeping your pelvis still, lift the right foot a few inches, put it down then lift left foot. This will mimic a march. Start slow to establish control. Once you have control you may speed it up. Do 10-20x. You MUST keep your lower abdominlas contracted while you march. Breathe naturally    3. Single leg slides: Inhale while you slowly slide one leg out keeping your pelvis still. Only slide your leg as far as you can keep your pelvis still. Exhale as you bring the leg back to the start, contracting the lower abdominals as you do that. Keep your upper body relaxed. Do 5-10 on each side.    [facility] [address], [address] [place], [place] [address] Phone # [phone] Fax [phone]       

## 2017-09-12 NOTE — Therapy (Signed)
[facility] [address] [place], [place], [address] Phone: [phone]   Fax:  [phone]  Physical Therapy Treatment  Patient Details  Name: Amy Burton MRN: [id number] Date of Birth: 10/19/1960 Referring Provider: Dr. Lyndal Pulley   Encounter Date: 09/12/2017  PT End of Session - 09/12/17 0924    Visit Number  10    Date for PT Re-Evaluation  09/19/17    Authorization Type  [employer]    Authorization - Visit Number  10    Authorization - Number of Visits  30    PT Start Time  0845    PT Stop Time  0923    PT Time Calculation (min)  38 min    Activity Tolerance  Patient tolerated treatment well    Behavior During Therapy  [facility] for tasks assessed/performed       Past Medical History:  Diagnosis Date  . Colon polyp   . Depression   . Thyroid disease     Past Surgical History:  Procedure Laterality Date  . BREAST SURGERY    . CESAREAN SECTION    . endomitriosis  1995    There were no vitals filed for this visit.  Subjective Assessment - 09/12/17 0847    Subjective  I feel better. I have less back pain. I have been practicing with the tool. I moved furniture and did alright. I was able wake up and feel fine.     Patient Stated Goals  reduce pain and improve strength    Currently in Pain?  No/denies    Multiple Pain Sites  No         [facility] PT Assessment - 09/12/17 0001      Assessment   Medical Diagnosis  M62.89 Pelvic floor dysfunction    Referring Provider  Dr. Lyndal Pulley    Prior Therapy  yes several session      Precautions   Precautions  None      Restrictions   Weight Bearing Restrictions  No      Home Environment   Living Environment  Private residence      Prior Function   Level of Independence  Independent    Leisure  standing to paint; sewing;       Cognition   Overall Cognitive Status  Within Functional Limits for tasks assessed      Observation/Other  Assessments   Skin Integrity  c-section increased mobiltiy      Posture/Postural Control   Posture/Postural Control  No significant limitations      Strength   Right Hip External Rotation   5/5    Right Hip Internal Rotation  5/5    Left Hip External Rotation  5/5    Left Hip Internal Rotation  5/5    Left Hip ABduction  5/5      Palpation   SI assessment   pelvis in correct alignment    Palpation comment  no tenderness in right psoas      Gaenslen's test   Findings  Positive    Side   Right    Comments  pain                          PT Education - 09/12/17 0923    Education provided  Yes    Education Details  abdominal strength and how to progress    Person(s) Educated  Patient  Methods  Explanation;Demonstration;Handout    Comprehension  Verbalized understanding;Returned demonstration       PT Short Term Goals - 07/11/17 0901      PT SHORT TERM GOAL #1   Title  independent with initial HEP with flexibility exercises    Time  4    Period  Weeks    Status  Achieved        PT Long Term Goals - 09/12/17 0850      PT LONG TERM GOAL #1   Title  independent with HEP and understand how to progress herself    Time  12    Period  Weeks    Status  Achieved      PT LONG TERM GOAL #2   Title  falling out feeling in the vaginal areal decreased >/= 50% due to improve tone of abdominals and pelvic floor    Time  12    Period  Weeks    Status  Achieved      PT LONG TERM GOAL #3   Title  pain in the vaginal area decreased >/= 75% due to improved tissue mobility and tenderness in the vaginal canal    Time  12    Period  Weeks    Status  Achieved      PT LONG TERM GOAL #4   Title  straining with a bowel movement decreased >/= 50% due to improved bowel health and coordination of the pelvic floor muscles    Time  12    Period  Weeks    Status  Achieved            Plan - 09/12/17 0174    Clinical Impression Statement  Patient has met all  of her goals. Bilateral hip strength is 5/5.  Pelvis in correct alignment. Patient understands how to progress her HEP.  Patient is using a tool to perform massage to pelvic floor internally.  Patient does not have a fallen out feeling.  Patient is ready for discharge.     Rehab Potential  Excellent    Clinical Impairments Affecting Rehab Potential  s/p laproscopic surgery for endometriosis    PT Treatment/Interventions  Biofeedback;Cryotherapy;Electrical Stimulation;Moist Heat;Ultrasound;Patient/family education;Neuromuscular re-education;Therapeutic exercise;Therapeutic activities;Manual techniques;Dry needling;Scar mobilization    PT Next Visit Plan  Discharge to HEP today    PT Home Exercise Plan  Current HEP    Consulted and Agree with Plan of Care  Patient       Patient will benefit from skilled therapeutic intervention in order to improve the following deficits and impairments:  Pain, Decreased strength, Decreased activity tolerance, Increased fascial restricitons, Increased muscle spasms, Decreased scar mobility  Visit Diagnosis: Muscle weakness (generalized)  Cramp and spasm     Problem List Patient Active Problem List   Diagnosis Date Noted  . Nonallopathic lesion of thoracic region 05/15/2017  . Nonallopathic lesion of cervical region 05/15/2017  . Nonallopathic lesion of sacral region 05/15/2017  . Chronic neck pain 04/30/2017  . Chronic back pain 04/30/2017  . Arthralgia of hip 04/30/2017  . Pelvic prolapse 04/30/2017  . 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 Eczema 09/07/2016  . Urinary urgency 09/07/2016  . Chronic sinusitis 09/29/2015  . Allergic rhinitis 09/29/2015  . Prediabetes 09/29/2015  . Vitamin D deficiency 09/29/2015  . Situational anxiety-flying 09/29/2015  . ETD (Eustachian tube dysfunction), bilateral 09/29/2015    Arcangel Minion 09/12/2017, 9:28 AM  [facility] [address]  [place], [place],  [address] Phone: [phone]   Fax:  [phone]  Name: Amy Burton MRN: [id number] Date of Birth: 21-May-1961  PHYSICAL THERAPY DISCHARGE SUMMARY  Visits from Start of Care: 10  Current functional level related to goals / functional outcomes: See above   Remaining deficits: See above   Education / Equipment: HEP Plan: Patient agrees to discharge.  Patient goals were met. Patient is being discharged due to meeting the stated rehab goals.  Thank you for the referral.Damichael Hofman Pearline Cables, PT 09/12/17 9:28 AM  ?????

## 2017-09-13 NOTE — Telephone Encounter (Signed)
Form has been faxed, mailing original to pt, placing copy in scan

## 2017-10-02 ENCOUNTER — Telehealth: Payer: Self-pay | Admitting: Obstetrics and Gynecology

## 2017-10-02 NOTE — Telephone Encounter (Signed)
Called and spoke with patient re:  Amy Burton, CMA  Curl, Starla        Please place on waitlist for NGYN ?vaginal prolapse.   Thanks    Patient reported she has been seeing a physical therapist for this and is doing better. She said she is due in July for an AEX and will call back to schedule that with Dr. Quincy Simmonds when she has her schedule available for reference.

## 2017-10-15 NOTE — Progress Notes (Signed)
[facility] [address] [place], [place] [address] Phone: [phone] Subjective:    I'm seeing this patient by the request  of:    CC: Neck and upper back pain  HER:[id number]  Amy Burton is a 57 y.o. female coming in with complaint of back pain. She said that her hips are doing well. She is having thoracic spine pain and cervical spine pain. She complains of numbness in cervical spine on the left side. She has had some relief lately with OMT.  Patient states that she has been having some stress.  May be moving again in the near future.     Past Medical History:  Diagnosis Date  . Colon polyp   . Depression   . Thyroid disease    Past Surgical History:  Procedure Laterality Date  . BREAST SURGERY    . CESAREAN SECTION    . endomitriosis  1995   Social History   Socioeconomic History  . Marital status: Married    Spouse name: None  . Number of children: None  . Years of education: None  . Highest education level: None  Social Needs  . Financial resource strain: None  . Food insecurity - worry: None  . Food insecurity - inability: None  . Transportation needs - medical: None  . Transportation needs - non-medical: None  Occupational History  . None  Tobacco Use  . Smoking status: Never Smoker  . Smokeless tobacco: Never Used  Substance and Sexual Activity  . Alcohol use: Yes  . Drug use: No  . Sexual activity: None  Other Topics Concern  . None  Social History Narrative  . None   Allergies  Allergen Reactions  . Sulfa Antibiotics    Family History  Problem Relation Age of Onset  . Hyperlipidemia Mother   . Hypertension Mother   . Diabetes Father   . Arthritis Sister   . Rheum arthritis Sister   . Heart disease Brother   . Heart attack Brother   . Cancer Maternal Grandmother        breast     Past medical history, social, surgical and family history all reviewed in electronic medical record.  No pertanent  information unless stated regarding to the chief complaint.   Review of Systems:Review of systems updated and as accurate as of 10/16/17  No headache, visual changes, nausea, vomiting, diarrhea, constipation, dizziness, abdominal pain, skin rash, fevers, chills, night sweats, weight loss, swollen lymph nodes, body aches, joint swelling, chest pain, shortness of breath, mood changes.  Positive muscle aches  Objective  Blood pressure 110/86, pulse 78, height 5\' 4"  (1.626 m), weight 173 lb (78.5 kg), SpO2 99 %. Systems examined below as of 10/16/17   General: No apparent distress alert and oriented x3 mood and affect normal, dressed appropriately.  HEENT: Pupils equal, extraocular movements intact  Respiratory: Patient's speak in full sentences and does not appear short of breath  Cardiovascular: No lower extremity edema, non tender, no erythema  Skin: Warm dry intact with no signs of infection or rash on extremities or on axial skeleton.  Abdomen: Soft nontender  Neuro: Cranial nerves II through XII are intact, neurovascularly intact in all extremities with 2+ DTRs and 2+ pulses.  Lymph: No lymphadenopathy of posterior or anterior cervical chain or axillae bilaterally.  Gait normal with good balance and coordination.  MSK:  Non tender with full range of motion and good stability and symmetric strength and tone of  shoulders, elbows, wrist, hip, knee and ankles bilaterally.  Neck: Inspection mild loss of lordosis. No palpable stepoffs. Negative Spurling's maneuver. Mild decreased range of motion left and right-sided rotation of 5 degrees also some mild limitation in extension Grip strength and sensation normal in bilateral hands Strength good C4 to T1 distribution No sensory change to C4 to T1 Negative Hoffman sign bilaterally Reflexes normal Tightness of the trapezius is bilaterally right greater than left Increased tenderness to palpation in the thoracolumbar junction right greater than  left  Osteopathic findings C2 flexed rotated and side bent right C4 flexed rotated and side bent left C6 flexed rotated and side bent right  T3 extended rotated and side bent right inhaled third rib L2 flexed rotated and side bent right Sacrum right on right     Impression and Recommendations:     This case required medical decision making of moderate complexity.      Note: This dictation was prepared with Dragon dictation along with smaller phrase technology. Any transcriptional errors that result from this process are unintentional.

## 2017-10-16 ENCOUNTER — Encounter: Payer: Self-pay | Admitting: Family Medicine

## 2017-10-16 ENCOUNTER — Ambulatory Visit (INDEPENDENT_AMBULATORY_CARE_PROVIDER_SITE_OTHER): Payer: PRIVATE HEALTH INSURANCE | Admitting: Family Medicine

## 2017-10-16 VITALS — BP 110/86 | HR 78 | Ht 64.0 in | Wt 173.0 lb

## 2017-10-16 DIAGNOSIS — M542 Cervicalgia: Secondary | ICD-10-CM

## 2017-10-16 DIAGNOSIS — M999 Biomechanical lesion, unspecified: Secondary | ICD-10-CM | POA: Diagnosis not present

## 2017-10-16 DIAGNOSIS — G8929 Other chronic pain: Secondary | ICD-10-CM | POA: Diagnosis not present

## 2017-10-16 NOTE — Assessment & Plan Note (Signed)
Decision today to treat with OMT was based on Physical Exam  After verbal consent patient was treated with HVLA, ME, FPR techniques in cervical, thoracic, lumbar and sacral areas  Patient tolerated the procedure well with improvement in symptoms  Patient given exercises, stretches and lifestyle modifications  See medications in patient instructions if given  Patient will follow up in 4-8 weeks 

## 2017-10-16 NOTE — Patient Instructions (Signed)
Overall great  Lets say 6 weeks

## 2017-10-16 NOTE — Assessment & Plan Note (Signed)
Known degenerative disc disease of the lumbar spine.  Cervical spine.  Continue to monitor closely.  Responded well to osteopathic manipulation.  Encourage the vitamin D supplementation.  Continue to work on Engineer, building services.  Follow-up with me again in 4-8 weeks

## 2017-10-22 ENCOUNTER — Other Ambulatory Visit (INDEPENDENT_AMBULATORY_CARE_PROVIDER_SITE_OTHER): Payer: PRIVATE HEALTH INSURANCE

## 2017-10-22 ENCOUNTER — Encounter: Payer: Self-pay | Admitting: Family Medicine

## 2017-10-22 ENCOUNTER — Ambulatory Visit (INDEPENDENT_AMBULATORY_CARE_PROVIDER_SITE_OTHER): Payer: PRIVATE HEALTH INSURANCE | Admitting: Family Medicine

## 2017-10-22 VITALS — BP 128/62 | HR 74 | Temp 98.0°F | Ht 64.0 in | Wt 173.0 lb

## 2017-10-22 DIAGNOSIS — R300 Dysuria: Secondary | ICD-10-CM

## 2017-10-22 LAB — URINALYSIS
BILIRUBIN URINE: NEGATIVE
KETONES UR: NEGATIVE
Leukocytes, UA: NEGATIVE
Nitrite: NEGATIVE
PH: 6 (ref 5.0–8.0)
Total Protein, Urine: NEGATIVE
Urine Glucose: NEGATIVE
Urobilinogen, UA: 0.2 (ref 0.0–1.0)

## 2017-10-22 NOTE — Patient Instructions (Signed)
We will call you with results from today. Please continue to drink plenty of water.

## 2017-10-22 NOTE — Assessment & Plan Note (Signed)
Possible for use versus bacteria. Her husband does have herpes but no external lesions. - Urinalysis and urine culture today.

## 2017-10-22 NOTE — Progress Notes (Signed)
Amy Burton - 57 y.o. female MRN [id number]  Date of birth: 1961/04/10  SUBJECTIVE:  Including CC & ROS.  Chief Complaint  Patient presents with  . Dysuria    Amy Burton is a 57 y.o. female that is presenting with dysuria. Patient states she has been urinate frequently and lower abdominal pain. Symptoms have been ongoing for one week. She has been taking motrin for the pain. Denies history of UTI. Denies fevers or chills.   Urine culture from 09/07/16 showed no growth.   Review of Systems  Constitutional: Negative for fever.  HENT: Negative for ear pain.   Respiratory: Negative for cough.   Cardiovascular: Negative for chest pain.  Gastrointestinal: Negative for abdominal pain.  Genitourinary: Positive for dysuria and frequency.  Musculoskeletal: Negative for gait problem.  Skin: Negative for color change.  Neurological: Negative for weakness.  Hematological: Negative for adenopathy.  Psychiatric/Behavioral: Negative for agitation.    HISTORY: Past Medical, Surgical, Social, and Family History Reviewed & Updated per EMR.   Pertinent Historical Findings include:  Past Medical History:  Diagnosis Date  . Colon polyp   . Depression   . Thyroid disease     Past Surgical History:  Procedure Laterality Date  . BREAST SURGERY    . CESAREAN SECTION    . endomitriosis  1995    Allergies  Allergen Reactions  . Sulfa Antibiotics     Family History  Problem Relation Age of Onset  . Hyperlipidemia Mother   . Hypertension Mother   . Diabetes Father   . Arthritis Sister   . Rheum arthritis Sister   . Heart disease Brother   . Heart attack Brother   . Cancer Maternal Grandmother        breast     Social History   Socioeconomic History  . Marital status: Married    Spouse name: Not on file  . Number of children: Not on file  . Years of education: Not on file  . Highest education level: Not on file  Social Needs  . Financial resource strain: Not on  file  . Food insecurity - worry: Not on file  . Food insecurity - inability: Not on file  . Transportation needs - medical: Not on file  . Transportation needs - non-medical: Not on file  Occupational History  . Not on file  Tobacco Use  . Smoking status: Never Smoker  . Smokeless tobacco: Never Used  Substance and Sexual Activity  . Alcohol use: Yes  . Drug use: No  . Sexual activity: Not on file  Other Topics Concern  . Not on file  Social History Narrative  . Not on file     PHYSICAL EXAM:  VS: BP 128/62 (BP Location: Left Arm, Patient Position: Sitting, Cuff Size: Normal)   Pulse 74   Temp 98 F (36.7 C) (Oral)   Ht 5\' 4"  (1.626 m)   Wt 173 lb (78.5 kg)   SpO2 98%   BMI 29.70 kg/m  Physical Exam Gen: NAD, alert, cooperative with exam, well-appearing ENT: normal lips, normal nasal mucosa,  Eye: normal EOM, normal conjunctiva and lids CV:  no edema, +2 pedal pulses   Resp: no accessory muscle use, non-labored,  GI: no masses or tenderness, no hernia, no suprapubic tenderness  Skin: no rashes, no areas of induration  Neuro: normal tone, normal sensation to touch Psych:  normal insight, alert and oriented MSK: normal gait, normal strength, no cva tenderness.  ASSESSMENT & PLAN:   Dysuria Possible for use versus bacteria. Her husband does have herpes but no external lesions. - Urinalysis and urine culture today.

## 2017-10-23 LAB — URINE CULTURE
MICRO NUMBER: 90249982
RESULT: NO GROWTH
SPECIMEN QUALITY:: ADEQUATE

## 2017-11-04 ENCOUNTER — Ambulatory Visit (INDEPENDENT_AMBULATORY_CARE_PROVIDER_SITE_OTHER): Payer: PRIVATE HEALTH INSURANCE | Admitting: Obstetrics and Gynecology

## 2017-11-04 ENCOUNTER — Encounter: Payer: Self-pay | Admitting: Obstetrics and Gynecology

## 2017-11-04 ENCOUNTER — Other Ambulatory Visit: Payer: Self-pay

## 2017-11-04 VITALS — BP 112/70 | HR 76 | Resp 16 | Ht 64.0 in | Wt 174.0 lb

## 2017-11-04 DIAGNOSIS — N816 Rectocele: Secondary | ICD-10-CM | POA: Diagnosis not present

## 2017-11-04 NOTE — Progress Notes (Signed)
GYNECOLOGY  VISIT   HPI: 57 y.o.   Married  Caucasian  female   [phone] with Patient's last menstrual period was 10/26/2015 (within months).   here for  Pelvic pain/prolapse. Patient was seeing Dr. Edwyna Ready at [facility]  Has prolapse for about 6 years.  Notices symptoms for 2 years.  Has had symptoms prompting some evaluation.  Saw urology for hematuria.  Had a bladder biopsy which was normal. Had a pelvic ultrasound for pain, and this showed fibroids. Sees physical therapy - Earlie Counts.  Has done well with her.  Does have constipation and has a rectocele and pushes on the vaginal area to have BMs. Does this 4 times per week. No fecal incontinence.   Has loss of control of bladder starting 2 weeks ago when she was really constipated.  Recent heavy lifting.  DF - could just stay on the toilet all day. NF - 2 -3 times per night.  Drinks a gallon of water daily.   Occasionally has a leak with cough, laugh, sneeze.  Occasionally feels something during sex.  In the last 2 weeks, not has pain with intercourse.  Feels pressure today due to intercourse.  Denies irritation.   Had a negative urine culture 10/22/17.  Self treated for vaginal yeast infection with Monistat. This helped.   Sees a natural doctor.  Goes to [facility] for this care. This is also a personal friend.   PCP - Dr. Billey Gosling  GYNECOLOGIC HISTORY: Patient's last menstrual period was 10/26/2015 (within months). Contraception:  Tubes and ovaries removed/postmenopausal Menopausal hormone therapy:  none Last mammogram:  Per patient does thermography every year Last pap smear:   01/24/17 Negative per patient        OB History    Gravida Para Term Preterm AB Living   5 3 0 0 1 3   SAB TAB Ectopic Multiple Live Births   1 0 0 0 0         Patient Active Problem List   Diagnosis Date Noted  . Dysuria 10/22/2017  . Nonallopathic lesion of thoracic region 05/15/2017  . Nonallopathic lesion of  cervical region 05/15/2017  . Nonallopathic lesion of sacral region 05/15/2017  . Chronic neck pain 04/30/2017  . Chronic back pain 04/30/2017  . Arthralgia of hip 04/30/2017  . Pelvic prolapse 04/30/2017  . 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 Eczema 09/07/2016  . Urinary urgency 09/07/2016  . Chronic sinusitis 09/29/2015  . Allergic rhinitis 09/29/2015  . Prediabetes 09/29/2015  . Vitamin D deficiency 09/29/2015  . Situational anxiety-flying 09/29/2015  . ETD (Eustachian tube dysfunction), bilateral 09/29/2015    Past Medical History:  Diagnosis Date  . Anxiety   . Colon polyp   . Depression   . Endometriosis   . Fibroid   . Thyroid disease     Past Surgical History:  Procedure Laterality Date  . APPENDECTOMY    . BREAST SURGERY    . CESAREAN SECTION    . endomitriosis  1995  . OOPHORECTOMY Right 1996  . SALPINGECTOMY Bilateral 1996    Current Outpatient Medications  Medication Sig Dispense Refill  . ALPRAZolam (XANAX) 0.25 MG tablet Take 1 tablet (0.25 mg total) by mouth daily as needed for anxiety (For Dental work and traveling). 30 tablet 0  . Ascorbic Acid (VITAMIN C) 1000 MG tablet Take 1,000 mg by mouth daily.    . B Complex Vitamins (VITAMIN B COMPLEX PO) Take by mouth.    . cetirizine (  ZYRTEC) 10 MG tablet Take 1 tablet (10 mg total) by mouth daily. 30 tablet 11  . CHROMIUM PO Take 2 tablets by mouth daily.    . Cyanocobalamin (VITAMIN B-12 PO) Take 5,000 mg by mouth daily.     Marland Kitchen ibuprofen (ADVIL,MOTRIN) 600 MG tablet Take 600 mg by mouth every 6 (six) hours as needed.    . TURMERIC PO Take by mouth daily.     No current facility-administered medications for this visit.      ALLERGIES: Sulfa antibiotics  Family History  Problem Relation Age of Onset  . Hyperlipidemia Mother   . Hypertension Mother   . Diabetes Father   . Prostate cancer Father   . Arthritis Sister   . Rheum arthritis Sister   . Heart disease Brother   . Heart attack Brother    . Cancer Maternal Grandmother        breast    Social History   Socioeconomic History  . Marital status: Married    Spouse name: Not on file  . Number of children: Not on file  . Years of education: Not on file  . Highest education level: Not on file  Social Needs  . Financial resource strain: Not on file  . Food insecurity - worry: Not on file  . Food insecurity - inability: Not on file  . Transportation needs - medical: Not on file  . Transportation needs - non-medical: Not on file  Occupational History  . Not on file  Tobacco Use  . Smoking status: Never Smoker  . Smokeless tobacco: Never Used  Substance and Sexual Activity  . Alcohol use: Yes    Comment: occ  . Drug use: No  . Sexual activity: Yes    Birth control/protection: Surgical    Comment: Salpingectomy/Oophorectomy   Other Topics Concern  . Not on file  Social History Narrative  . Not on file    ROS:  Pertinent items are noted in HPI.  PHYSICAL EXAMINATION:    BP 112/70 (BP Location: Right Arm, Patient Position: Sitting, Cuff Size: Normal)   Pulse 76   Resp 16   Ht 5\' 4"  (1.626 m)   Wt 174 lb (78.9 kg)   LMP 10/26/2015 (Within Months)   BMI 29.87 kg/m     General appearance: alert, cooperative and appears stated age Head: Normocephalic, without obvious abnormality, atraumatic Neck: no adenopathy, supple, symmetrical, trachea midline and thyroid normal to inspection and palpation Lungs: clear to auscultation bilaterally Heart: regular rate and rhythm Abdomen: soft, non-tender, no masses,  no organomegaly Extremities: extremities normal, atraumatic, no cyanosis or edema Skin: Skin color, texture, turgor normal. No rashes or lesions No abnormal inguinal nodes palpated Neurologic: Grossly normal  Pelvic: External genitalia:  no lesions              Urethra:  normal appearing urethra with no masses, tenderness or lesions              Bartholins and Skenes: normal                 Vagina: normal  appearing vagina with normal color and discharge, no lesions.  Almost second degree rectocele.  Good bladder and uterine support.  Examined sitting and standing.               Cervix: no lesions                Bimanual Exam:  Uterus:  normal size, contour, position, consistency,  mobility, non-tender              Adnexa: no mass, fullness, tenderness              Rectal exam: Yes.  .  Confirms.              Anus:  normal sphincter tone, no lesions  Chaperone was present for exam.  ASSESSMENT  Urinary frequency.  Rectocele. Status post Cesarean Section.  Status post right oophorectomy.  Status post bilateral salpingectomy.  Hx endometriosis.    PLAN  Comprehensive discussion of pelvic organ prolapse and urinary frequency - risk factors, life history, and treatment options - observation, surgery, pessary use. Rectocele surgical procedure reviewed. 3D pelvic model used. ACOG reading material on pelvic organ prolapse to patient.  She will return for pessary fitting.  She would like to return for an annual exam after May 31st.    An After Visit Summary was printed and given to the patient.  _45_____ minutes face to face time of which over 50% was spent in counseling.

## 2017-11-11 ENCOUNTER — Encounter: Payer: Self-pay | Admitting: Physical Therapy

## 2017-11-11 ENCOUNTER — Other Ambulatory Visit: Payer: Self-pay

## 2017-11-11 ENCOUNTER — Ambulatory Visit: Payer: 59 | Attending: Family Medicine | Admitting: Physical Therapy

## 2017-11-11 DIAGNOSIS — R252 Cramp and spasm: Secondary | ICD-10-CM | POA: Diagnosis present

## 2017-11-11 DIAGNOSIS — M6281 Muscle weakness (generalized): Secondary | ICD-10-CM

## 2017-11-11 NOTE — Patient Instructions (Signed)
About Rectocele Overview A rectocele is a type of hernia which causes different degrees of bulging of the rectal tissues into the vaginal wall. You may even notice that it presses against the vaginal wall so much that some vaginal tissues droop outside of the opening of your vagina.  Causes of Rectocele The most common cause is childbirth. The muscles and ligaments in the pelvis that hold up and support the female organs and vagina become stretched and weakened during labor and delivery. The more babies you have, the more the support tissues are stretched and weakened. Not everyone who has a baby will develop a rectocele. Some women have stronger supporting tissue in the pelvis and may not have as much of a problem as others. Women who have a Cesarean section usually do not get rectoceles unless they pushed a long time prior to the cesarean delivery.  Other conditions that can cause a rectocele include chronic constipation, a chronic cough, a lot of heavy lifting, and obesity. Older women may have this problem because the loss of female hormones causes the vaginal tissue to become weaker.  Symptoms There may not be any symptoms. If you do have symptoms, they may include:  . pelvic pressure in the rectal area  . protrusion of the lower part of the vagina through the opening of the vagina  . constipation and trapping of the stool, making it difficult to have a bowel movement. In severe cases, you may have to press on the lower part of your vagina to help push the stool out of your rectum.  This is called splinting to empty. Diagnosing Rectocele Your health care provider will ask about your symptoms and perform a pelvic exam. S/he will ask you to bear down, pushing like you are having a bowel movement so as to see how far the lower part of the vagina protrudes into the vagina and possibly outside of the vagina. Your provider will also ask you to contract the muscles of your pelvis (like you are stopping  the stream in the middle of urinating) to determine the strength of your pelvic muscles. Your provider may also do a rectal exam.    Treatment Options If you do not have any symptoms, no treatment may be necessary. Other treatment options include:  . Pelvic floor exercises: Contracting the muscles in your genital area may help strengthen your muscles and support the organs.  Be sure to get proper exercise instruction from your physical therapist. . A pessary (removable pelvic support device) sometimes helps rectocele symptoms. . Surgery: Surgical repair may be necessary. In some cases the uterus may need to be taken out (a hysterectomy) as well.  There are many types of surgery for pelvic support problems.  Look for physicians who specialize in repair procedures. You can take care of yourself by:  . treating and preventing constipation  . avoiding heavy lifting, and lifting correctly (with your legs, not with your waist or back)  . treating a chronic cough or bronchitis  . not smoking  . avoiding too much weight gain  . doing pelvic floor exercises  Toileting Techniques for Bowel Movements (Defecation) Using your belly (abdomen) and pelvic floor muscles to have a bowel movement is usually instinctive.  Sometimes people can have problems with these muscles and have to relearn proper defecation (emptying) techniques.  If you have weakness in your muscles, organs that are falling out, decreased sensation in your pelvis, or ignore your urge to go, you may  find yourself straining to have a bowel movement.  You are straining if you are: . holding your breath or taking in a huge gulp of air and holding it  . keeping your lips and jaw tensed and closed tightly . turning red in the face because of excessive pushing or forcing . developing or worsening your  hemorrhoids . getting faint while pushing . not emptying completely and have to defecate many times a day  If you are straining, you are actually  making it harder for yourself to have a bowel movement.  Many people find they are pulling up with the pelvic floor muscles and closing off instead of opening the anus. Due to lack pelvic floor relaxation and coordination the abdominal muscles, one has to work harder to push the feces out.  Many people have never been taught how to defecate efficiently and effectively.  Notice what happens to your body when you are having a bowel movement.  While you are sitting on the toilet pay attention to the following areas: . Jaw and mouth position . Angle of your hips   . Whether your feet touch the ground or not . Arm placement  . Spine position . Waist . Belly tension . Anus (opening of the anal canal)  An Evacuation/Defecation Plan   Here are the 4 basic points:  1. Lean forward enough for your elbows to rest on your knees 2. Support your feet on the floor or use a low stool if your feet don't touch the floor  3. Push out your belly as if you have swallowed a beach ball-you should feel a widening of your waist 4. Open and relax your pelvic floor muscles, rather than tightening around the anus      The following conditions my require modifications to your toileting posture:  . If you have had surgery in the past that limits your back, hip, pelvic, knee or ankle flexibility . Constipation   Your healthcare practitioner may make the following additional suggestions and adjustments:  1) Sit on the toilet  a) Make sure your feet are supported. b) Notice your hip angle and spine position-most people find it effective to lean forward or raise their knees, which can help the muscles around the anus to relax  c) When you lean forward, place your forearms on your thighs for support  2) Relax suggestions a) Breath deeply in through your nose and out slowly through your mouth as if you are smelling the flowers and blowing out the candles. b) To become aware of how to relax your muscles, contracting  and releasing muscles can be helpful.  Pull your pelvic floor muscles in tightly by using the image of holding back gas, or closing around the anus (visualize making a circle smaller) and lifting the anus up and in.  Then release the muscles and your anus should drop down and feel open. Repeat 5 times ending with the feeling of relaxation. c) Keep your pelvic floor muscles relaxed; let your belly bulge out. d) The digestive tract starts at the mouth and ends at the anal opening, so be sure to relax both ends of the tube.  Place your tongue on the roof of your mouth with your teeth separated.  This helps relax your mouth and will help to relax the anus at the same time.  3) Empty (defecation) a) Keep your pelvic floor and sphincter relaxed, then bulge your anal muscles.  Make the anal opening wide.  b)  Stick your belly out as if you have swallowed a beach ball. c) Make your belly wall hard using your belly muscles while continuing to breathe. Doing this makes it easier to open your anus. d) Breath out and give a grunt (or try using other sounds such as ahhhh, shhhhh, ohhhh or grrrrrrr).  4) Finish a) As you finish your bowel movement, pull the pelvic floor muscles up and in.  This will leave your anus in the proper place rather than remaining pushed out and down. If you leave your anus pushed out and down, it will start to feel as though that is normal and give you incorrect signals about needing to have a bowel movement.    Earlie Counts, PT [facility] [address] [place], [place] [address]

## 2017-11-11 NOTE — Therapy (Signed)
[facility] [address] [place], [place], [address] Phone: [phone]   Fax:  [phone]  Physical Therapy Evaluation  Patient Details  Name: Amy Burton MRN: [id number] Date of Birth: Oct 11, 1960 Referring Provider: Dr. Josefa Half   Encounter Date: 11/11/2017  PT End of Session - 11/11/17 1158    Visit Number  1    Date for PT Re-Evaluation  01/06/18    Authorization Type  [employer]    Authorization - Visit Number  1    Authorization - Number of Visits  28 2 already used this year    PT Start Time  0845    PT Stop Time  0925    PT Time Calculation (min)  40 min    Activity Tolerance  Patient tolerated treatment well    Behavior During Therapy  [facility] for tasks assessed/performed       Past Medical History:  Diagnosis Date  . Anxiety   . Colon polyp   . Depression   . Endometriosis   . Fibroid   . Thyroid disease     Past Surgical History:  Procedure Laterality Date  . APPENDECTOMY    . BREAST SURGERY    . CESAREAN SECTION    . endomitriosis  1995  . OOPHORECTOMY Right 1996  . SALPINGECTOMY Bilateral 1996    There were no vitals filed for this visit.   Subjective Assessment - 11/11/17 0853    Subjective  I have had a rectocele for years.  2-3 weeks I started  felt she had to urinate all the time. I have to place my finger in the vaginal canal and push up and back for successfull bowel movement.     Patient Stated Goals  reduce pain and improve strength    Currently in Pain?  Yes    Pain Score  5  7 worse    Pain Location  Vagina    Pain Orientation  Mid    Pain Descriptors / Indicators  Pressure;Stabbing;Aching;Burning    Pain Type  Acute pain    Pain Onset  1 to 4 weeks ago    Pain Frequency  Intermittent    Aggravating Factors   urniating, standing, squat,     Pain Relieving Factors  lay on back with feel elevated    Multiple Pain Sites  No         [facility] PT Assessment - 11/11/17  0001      Assessment   Medical Diagnosis  N81.6 Rectocele    Referring Provider  Dr. Josefa Half    Onset Date/Surgical Date  10/28/17    Prior Therapy  yes      Precautions   Precautions  None      Restrictions   Weight Bearing Restrictions  No      Balance Screen   Has the patient fallen in the past 6 months  Yes    How many times?  1 missed top step so fell on concrete driveway    Has the patient had a decrease in activity level because of a fear of falling?   No    Is the patient reluctant to leave their home because of a fear of falling?   No      Home Environment   Living Environment  Private residence      Prior Function   Level of Independence  Independent    Leisure  standing to paint; sewing;  Cognition   Overall Cognitive Status  Within Functional Limits for tasks assessed      Posture/Postural Control   Posture/Postural Control  No significant limitations      ROM / Strength   AROM / PROM / Strength  AROM;PROM;Strength      AROM   Overall AROM   Within functional limits for tasks performed      PROM   Overall PROM   Within functional limits for tasks performed      Strength   Overall Strength Comments  abdominal strength 3/5    Right Hip External Rotation   4/5    Right Hip Internal Rotation  4/5    Right Hip ABduction  4+/5    Left Hip Flexion  4/5    Left Hip External Rotation  4/5    Left Hip Internal Rotation  4/5    Left Hip ABduction  4+/5      Palpation   SI assessment   pelvis in correct alignment      Special Tests    Special Tests  Sacrolliac Tests      Gaenslen's test   Findings  Negative      Trendelenburg Test   Findings  Negative      Transfers   Transfers  Not assessed      Ambulation/Gait   Ambulation/Gait  No             Objective measurements completed on examination: See above findings.    Pelvic Floor Special Questions - 11/11/17 0001    Currently Sexually Active  Yes    Is this Painful  Yes     Marinoff Scale  discomfort that does not affect completion    Urinary Leakage  No has to contract the pelvic floor muscles    Urinary urgency  Yes    Fecal incontinence  No more constipated    Falling out feeling (prolapse)  Yes    Skin Integrity  Intact    Prolapse  Posterior Wall    Pelvic Floor Internal Exam  patient confims identification and approves PT to assess pelvic floor muscle integrity    Exam Type  Vaginal    Palpation  tenderness located on the left side of the rectum, left puborectalis, left ishiococcygeus, left puborectalis    Strength  good squeeze, good lift, able to hold agaisnt strong resistance    Tone  increased on left               PT Education - 11/11/17 1157    Education provided  Yes    Education Details  What is a rectocele, information on toileting, splinting method for bowel movements    Methods  Explanation;Demonstration;Verbal cues;Handout    Comprehension  Returned demonstration;Verbalized understanding       PT Short Term Goals - 11/11/17 1203      PT SHORT TERM GOAL #1   Title  independent with initial HEP with flexibility exercises    Time  4    Period  Weeks    Status  New    Target Date  12/09/17      PT SHORT TERM GOAL #2   Title  understand correct toileting technique to reduce strain on the pelvic floor    Time  4    Period  Weeks    Status  New    Target Date  12/09/17      PT SHORT TERM GOAL #3   Title  how  to splint the pelvic floor to reduce rectocele while bathrooming    Time  4    Period  Weeks    Status  New    Target Date  12/09/17      PT SHORT TERM GOAL #4   Title  understand how to perform abdominal massage to promote improved bowel health and decrease strain on the pelvic floor    Time  4    Period  Weeks    Status  New    Target Date  12/09/17        PT Long Term Goals - 11/11/17 1206      PT LONG TERM GOAL #1   Title  independent with HEP and understand how to progress herself    Time  8     Period  Weeks    Status  New    Target Date  01/06/18      PT LONG TERM GOAL #2   Title  hold pelvic floor contraction for 10 seconds due to increased endurance of pelvic floor muscle strength    Time  8    Period  Weeks    Status  New    Target Date  01/06/18      PT LONG TERM GOAL #3   Title  pain in the vaginal area decreased >/= 75% due to improved tissue mobility and tenderness in the vaginal canal    Time  8    Period  Weeks    Status  New    Target Date  01/06/18      PT LONG TERM GOAL #4   Title  straining with a bowel movement decreased >/= 50% due to improved bowel health and coordination of the pelvic floor muscles    Time  8    Period  Weeks    Status  New    Target Date  01/06/18             Plan - 11/11/17 2993    Clinical Impression Statement  Patient is a 57 year old female with rectocele that has caused pain in the past 2 weeks.  Patient reports the pain ranges from 5-7/10 and is intermittent.  Pain is worse with squatting, standing, urinating and will decrease with laying on her back with legs elevated.  Patient pelvic floor strength is 4/5 with contracting for 5 seconds.  Tenderness located on left side of the rectum on the left puborectalis, left ischiococcygeus, and posterior left introitus.  Patient has bulge on the posterior wall when bears down to the introitus.  Bilateral hips strength is 4/5.  Patient will benefit from skilled therapy to improve pelvic floor strength and endurance and education on management of rectocele.     History and Personal Factors relevant to plan of care:  s/p laproscopic surgery for endometriosis    Clinical Presentation  Stable    Clinical Presentation due to:  stable condition    Clinical Decision Making  Low    Rehab Potential  Excellent    Clinical Impairments Affecting Rehab Potential  s/p laproscopic surgery for endometriosis    PT Frequency  1x / week    PT Duration  8 weeks    PT Treatment/Interventions   Biofeedback;Cryotherapy;Electrical Stimulation;Moist Heat;Ultrasound;Patient/family education;Neuromuscular re-education;Therapeutic exercise;Therapeutic activities;Manual techniques;Dry needling    PT Next Visit Plan  pelvic floor strength, soft tissue work to the left pelvic floor, ways to manage rectocele, core strength    PT Home Exercise Plan  progress as needed    Consulted and Agree with Plan of Care  Patient       Patient will benefit from skilled therapeutic intervention in order to improve the following deficits and impairments:  Pain, Decreased strength, Decreased activity tolerance, Increased fascial restricitons, Increased muscle spasms  Visit Diagnosis: Muscle weakness (generalized) - Plan: PT plan of care cert/re-cert  Cramp and spasm - Plan: PT plan of care cert/re-cert     Problem List Patient Active Problem List   Diagnosis Date Noted  . Dysuria 10/22/2017  . Nonallopathic lesion of thoracic region 05/15/2017  . Nonallopathic lesion of cervical region 05/15/2017  . Nonallopathic lesion of sacral region 05/15/2017  . Chronic neck pain 04/30/2017  . Chronic back pain 04/30/2017  . Arthralgia of hip 04/30/2017  . Pelvic prolapse 04/30/2017  . 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 Eczema 09/07/2016  . Urinary urgency 09/07/2016  . Chronic sinusitis 09/29/2015  . Allergic rhinitis 09/29/2015  . Prediabetes 09/29/2015  . Vitamin D deficiency 09/29/2015  . Situational anxiety-flying 09/29/2015  . ETD (Eustachian tube dysfunction), bilateral 09/29/2015    Earlie Counts, PT 11/11/17 12:17 PM   [facility] [address], [place], [place], [address] Phone: [phone]   Fax:  [phone]  Name: Terrian Ridlon MRN: [id number] Date of Birth: [birth year]

## 2017-11-14 ENCOUNTER — Other Ambulatory Visit: Payer: Self-pay

## 2017-11-14 ENCOUNTER — Ambulatory Visit (INDEPENDENT_AMBULATORY_CARE_PROVIDER_SITE_OTHER): Payer: PRIVATE HEALTH INSURANCE | Admitting: Obstetrics and Gynecology

## 2017-11-14 ENCOUNTER — Encounter: Payer: Self-pay | Admitting: Obstetrics and Gynecology

## 2017-11-14 VITALS — BP 116/60 | HR 88 | Resp 16 | Ht 64.0 in | Wt 173.0 lb

## 2017-11-14 DIAGNOSIS — N816 Rectocele: Secondary | ICD-10-CM

## 2017-11-14 NOTE — Progress Notes (Signed)
GYNECOLOGY  VISIT   HPI: 57 y.o.   Married  Caucasian  female   [phone] with Patient's last menstrual period was 10/26/2015 (within months).   here for pessary fitting.  Has a second degree rectocele.   GYNECOLOGIC HISTORY: Patient's last menstrual period was 10/26/2015 (within months). Contraception:  Tubes and right ovary removed/postmenopausal Menopausal hormone therapy:  none Last mammogram:  Per patient does thermography every year Last pap smear:   01/24/17 Negative        OB History    Gravida  5   Para  3   Term  0   Preterm  0   AB  1   Living  3     SAB  1   TAB  0   Ectopic  0   Multiple  0   Live Births  0              Patient Active Problem List   Diagnosis Date Noted  . Dysuria 10/22/2017  . Nonallopathic lesion of thoracic region 05/15/2017  . Nonallopathic lesion of cervical region 05/15/2017  . Nonallopathic lesion of sacral region 05/15/2017  . Chronic neck pain 04/30/2017  . Chronic back pain 04/30/2017  . Arthralgia of hip 04/30/2017  . Pelvic prolapse 04/30/2017  . 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 Eczema 09/07/2016  . Urinary urgency 09/07/2016  . Chronic sinusitis 09/29/2015  . Allergic rhinitis 09/29/2015  . Prediabetes 09/29/2015  . Vitamin D deficiency 09/29/2015  . Situational anxiety-flying 09/29/2015  . ETD (Eustachian tube dysfunction), bilateral 09/29/2015    Past Medical History:  Diagnosis Date  . Anxiety   . Colon polyp   . Depression   . Endometriosis   . Fibroid   . Thyroid disease     Past Surgical History:  Procedure Laterality Date  . APPENDECTOMY    . BREAST SURGERY    . CESAREAN SECTION    . endomitriosis  1995  . OOPHORECTOMY Right 1996  . SALPINGECTOMY Bilateral 1996    Current Outpatient Medications  Medication Sig Dispense Refill  . ALPRAZolam (XANAX) 0.25 MG tablet Take 1 tablet (0.25 mg total) by mouth daily as needed for anxiety (For Dental work and traveling). 30 tablet 0  .  Ascorbic Acid (VITAMIN C) 1000 MG tablet Take 1,000 mg by mouth daily.    . B Complex Vitamins (VITAMIN B COMPLEX PO) Take by mouth.    . cetirizine (ZYRTEC) 10 MG tablet Take 1 tablet (10 mg total) by mouth daily. 30 tablet 11  . CHROMIUM PO Take 2 tablets by mouth daily.    . Cyanocobalamin (VITAMIN B-12 PO) Take 5,000 mg by mouth daily.     Marland Kitchen ibuprofen (ADVIL,MOTRIN) 600 MG tablet Take 600 mg by mouth every 6 (six) hours as needed.    . TURMERIC PO Take by mouth daily.     No current facility-administered medications for this visit.      ALLERGIES: Sulfa antibiotics  Family History  Problem Relation Age of Onset  . Hyperlipidemia Mother   . Hypertension Mother   . Diabetes Father   . Prostate cancer Father   . Arthritis Sister   . Rheum arthritis Sister   . Heart disease Brother   . Heart attack Brother   . Cancer Maternal Grandmother        breast    Social History   Socioeconomic History  . Marital status: Married    Spouse name: Not on file  . Number  of children: Not on file  . Years of education: Not on file  . Highest education level: Not on file  Occupational History  . Not on file  Social Needs  . Financial resource strain: Not on file  . Food insecurity:    Worry: Not on file    Inability: Not on file  . Transportation needs:    Medical: Not on file    Non-medical: Not on file  Tobacco Use  . Smoking status: Never Smoker  . Smokeless tobacco: Never Used  Substance and Sexual Activity  . Alcohol use: Yes    Comment: occ  . Drug use: No  . Sexual activity: Yes    Birth control/protection: Surgical    Comment: Salpingectomy/Oophorectomy   Lifestyle  . Physical activity:    Days per week: Not on file    Minutes per session: Not on file  . Stress: Not on file  Relationships  . Social connections:    Talks on phone: Not on file    Gets together: Not on file    Attends religious service: Not on file    Active member of club or organization: Not on  file    Attends meetings of clubs or organizations: Not on file    Relationship status: Not on file  . Intimate partner violence:    Fear of current or ex partner: Not on file    Emotionally abused: Not on file    Physically abused: Not on file    Forced sexual activity: Not on file  Other Topics Concern  . Not on file  Social History Narrative  . Not on file    ROS:  Pertinent items are noted in HPI.  PHYSICAL EXAMINATION:    BP 116/60 (BP Location: Left Arm, Patient Position: Sitting, Cuff Size: Large)   Pulse 88   Resp 16   Ht 5\' 4"  (1.626 m)   Wt 173 lb (78.5 kg)   LMP 10/26/2015 (Within Months)   BMI 29.70 kg/m     General appearance: alert, cooperative and appears stated age Head: Normocephalic, without obvious abnormality, atraumatic Neck: no adenopathy, supple, symmetrical, trachea midline and thyroid normal to inspection and palpation Lungs: clear to auscultation bilaterally Breasts: normal appearance, no masses or tenderness, No nipple retraction or dimpling, No nipple discharge or bleeding, No axillary or supraclavicular adenopathy Heart: regular rate and rhythm Abdomen: soft, non-tender, no masses,  no organomegaly Extremities: extremities normal, atraumatic, no cyanosis or edema Skin: Skin color, texture, turgor normal. No rashes or lesions Lymph nodes: Cervical, supraclavicular, and axillary nodes normal. No abnormal inguinal nodes palpated Neurologic: Grossly normal  Pelvic: External genitalia:  no lesions              Urethra:  normal appearing urethra with no masses, tenderness or lesions                              Bimanual Exam:  Uterus:  normal size, contour, position, consistency, mobility, non-tender              Adnexa: no mass, fullness, tenderness              Pessary fitting. #4 ring with support comfortable and patient able to maintain with maneuvers.  A little bit of the rectocele is still visible and palpable with a Valsalva maneuver and  the pessary in. #5 ring with support caused lateral pressure in the vagina.  Patient received a #4 ring with support pessary from [place], [place], exp 02/24/2027.   Chaperone was present for exam.  ASSESSMENT  Rectocele.  PLAN  Pessary fitted.  Patient is able to place and remove.  She will follow up in 10 - 14 days due to her upcoming travel.  We discussed potential for local vaginal estrogen use if needed.    An After Visit Summary was printed and given to the patient.  15______ minutes face to face time of which over 50% was spent in counseling.

## 2017-11-20 ENCOUNTER — Encounter: Payer: Self-pay | Admitting: Physical Therapy

## 2017-11-20 ENCOUNTER — Ambulatory Visit: Payer: 59 | Admitting: Physical Therapy

## 2017-11-20 DIAGNOSIS — R252 Cramp and spasm: Secondary | ICD-10-CM

## 2017-11-20 DIAGNOSIS — M6281 Muscle weakness (generalized): Secondary | ICD-10-CM

## 2017-11-20 NOTE — Patient Instructions (Addendum)
Abdominal Bracing With Pelvic Floor (Hook-Lying)    With neutral spine, tighten pelvic floor and abdominals. Place towel under low back. Feel the pubic bone rock up slightly. Hold 5 sec. Repeat _10__ times. Do 1__ times a day.   Copyright  VHI. All rights reserved.    Bracing With Knee Fallout (Hook-Lying)    With neutral spine, tighten pelvic floor and abdominals and hold. Alternating legs, drop knee out to side. Keep opposite hip still. Repeat _10__ times.Each leg Do __1_ times a day.   Copyright  VHI. All rights reserved.  About Rectocele Overview A rectocele is a type of hernia which causes different degrees of bulging of the rectal tissues into the vaginal wall. You may even notice that it presses against the vaginal wall so much that some vaginal tissues droop outside of the opening of your vagina.  Causes of Rectocele The most common cause is childbirth. The muscles and ligaments in the pelvis that hold up and support the female organs and vagina become stretched and weakened during labor and delivery. The more babies you have, the more the support tissues are stretched and weakened. Not everyone who has a baby will develop a rectocele. Some women have stronger supporting tissue in the pelvis and may not have as much of a problem as others. Women who have a Cesarean section usually do not get rectoceles unless they pushed a long time prior to the cesarean delivery.  Other conditions that can cause a rectocele include chronic constipation, a chronic cough, a lot of heavy lifting, and obesity. Older women may have this problem because the loss of female hormones causes the vaginal tissue to become weaker.  Symptoms There may not be any symptoms. If you do have symptoms, they may include:  . pelvic pressure in the rectal area  . protrusion of the lower part of the vagina through the opening of the vagina  . constipation and trapping of the stool, making it difficult to have a bowel  movement. In severe cases, you may have to press on the lower part of your vagina to help push the stool out of your rectum.  This is called splinting to empty. Diagnosing Rectocele Your health care provider will ask about your symptoms and perform a pelvic exam. S/he will ask you to bear down, pushing like you are having a bowel movement so as to see how far the lower part of the vagina protrudes into the vagina and possibly outside of the vagina. Your provider will also ask you to contract the muscles of your pelvis (like you are stopping the stream in the middle of urinating) to determine the strength of your pelvic muscles. Your provider may also do a rectal exam.    Treatment Options If you do not have any symptoms, no treatment may be necessary. Other treatment options include:  . Pelvic floor exercises: Contracting the muscles in your genital area may help strengthen your muscles and support the organs.  Be sure to get proper exercise instruction from your physical therapist. . A pessary (removable pelvic support device) sometimes helps rectocele symptoms. . Surgery: Surgical repair may be necessary. In some cases the uterus may need to be taken out (a hysterectomy) as well.  There are many types of surgery for pelvic support problems.  Look for physicians who specialize in repair procedures. You can take care of yourself by:  . treating and preventing constipation  . avoiding heavy lifting, and lifting correctly (with your legs,  not with your waist or back)  . treating a chronic cough or bronchitis  . not smoking  . avoiding too much weight gain  . doing pelvic floor exercises   [facility] [address], [place], [place] [address] Phone # [phone] Fax [phone]

## 2017-11-20 NOTE — Therapy (Signed)
[facility] [address], [place], [place], [address] Phone: [phone]   Fax:  [phone]  Physical Therapy Treatment  Patient Details  Name: Amy Burton MRN: [id number] Date of Birth: 1960/09/24 Referring Provider: Dr. Josefa Half   Encounter Date: 11/20/2017  PT End of Session - 11/20/17 1011    Visit Number  2    Date for PT Re-Evaluation  01/06/18    Authorization Type  [employer]    Authorization - Visit Number  2    Authorization - Number of Visits  28 2 already used this year    PT Start Time  0930    PT Stop Time  1010    PT Time Calculation (min)  40 min    Activity Tolerance  Patient tolerated treatment well    Behavior During Therapy  [facility] for tasks assessed/performed       Past Medical History:  Diagnosis Date  . Anxiety   . Colon polyp   . Depression   . Endometriosis   . Fibroid   . Thyroid disease     Past Surgical History:  Procedure Laterality Date  . APPENDECTOMY    . BREAST SURGERY    . CESAREAN SECTION    . endomitriosis  1995  . OOPHORECTOMY Right 1996  . SALPINGECTOMY Bilateral 1996    There were no vitals filed for this visit.  Subjective Assessment - 11/20/17 0935    Subjective  I felt the same after last visit.  I am using the pessary.  I got the pessary on 11/14/2017.  Today is the best day I have had in weeks.     Patient Stated Goals  reduce pain and improve strength    Currently in Pain?  No/denies                No data recorded    Pelvic Floor Special Questions - 11/20/17 0001    Pelvic Floor Internal Exam  patient confims identification and approves PT to assess pelvic floor muscle integrity    Exam Type  Vaginal    Palpation  restrictions on bil. sides of the urethra and rectum    Strength  good squeeze, good lift, able to hold agaisnt strong resistance        [facility] Adult PT Treatment/Exercise - 11/20/17 0001      Manual Therapy   Manual  Therapy  Myofascial release    Myofascial Release  internal to work on sides of rectum and urethra sphincter             PT Education - 11/20/17 1010    Education provided  Yes    Education Details  abdominal bracing, knee fallout with abdominal bracing, infromation on rectocele    Person(s) Educated  Patient    Methods  Explanation;Demonstration;Verbal cues;Handout    Comprehension  Returned demonstration;Verbalized understanding       PT Short Term Goals - 11/20/17 1014      PT SHORT TERM GOAL #1   Title  independent with initial HEP with flexibility exercises    Time  4    Period  Weeks    Status  On-going        PT Long Term Goals - 11/11/17 1206      PT LONG TERM GOAL #1   Title  independent with HEP and understand how to progress herself    Time  8    Period  Weeks  Status  New    Target Date  01/06/18      PT LONG TERM GOAL #2   Title  hold pelvic floor contraction for 10 seconds due to increased endurance of pelvic floor muscle strength    Time  8    Period  Weeks    Status  New    Target Date  01/06/18      PT LONG TERM GOAL #3   Title  pain in the vaginal area decreased >/= 75% due to improved tissue mobility and tenderness in the vaginal canal    Time  8    Period  Weeks    Status  New    Target Date  01/06/18      PT LONG TERM GOAL #4   Title  straining with a bowel movement decreased >/= 50% due to improved bowel health and coordination of the pelvic floor muscles    Time  8    Period  Weeks    Status  New    Target Date  01/06/18            Plan - 11/20/17 0936    Clinical Impression Statement  Patient had restrictions on bilateral sides of the rectocele and sides of the urethra. After manual work increased mobility.  Patient is wearing the pessary and feels better with it. Patient understands ways to manage the rectocele.  Patient need education on lifting correctly.  Patient will benefit from skilled therapy to improve pelvic  floor strength and endurance and education on management of rectocele.     Rehab Potential  Excellent    Clinical Impairments Affecting Rehab Potential  s/p laproscopic surgery for endometriosis    PT Frequency  1x / week    PT Duration  8 weeks    PT Treatment/Interventions  Biofeedback;Cryotherapy;Electrical Stimulation;Moist Heat;Ultrasound;Patient/family education;Neuromuscular re-education;Therapeutic exercise;Therapeutic activities;Manual techniques;Dry needling    PT Next Visit Plan  pelvic floor strength, soft tissue work to the left pelvic floor, ways to manage rectocele, core strength; lifting correctly    PT Home Exercise Plan  progress as needed    Consulted and Agree with Plan of Care  Patient       Patient will benefit from skilled therapeutic intervention in order to improve the following deficits and impairments:  Pain, Decreased strength, Decreased activity tolerance, Increased fascial restricitons, Increased muscle spasms  Visit Diagnosis: Muscle weakness (generalized)  Cramp and spasm     Problem List Patient Active Problem List   Diagnosis Date Noted  . Dysuria 10/22/2017  . Nonallopathic lesion of thoracic region 05/15/2017  . Nonallopathic lesion of cervical region 05/15/2017  . Nonallopathic lesion of sacral region 05/15/2017  . Chronic neck pain 04/30/2017  . Chronic back pain 04/30/2017  . Arthralgia of hip 04/30/2017  . Pelvic prolapse 04/30/2017  . 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 Eczema 09/07/2016  . Urinary urgency 09/07/2016  . Chronic sinusitis 09/29/2015  . Allergic rhinitis 09/29/2015  . Prediabetes 09/29/2015  . Vitamin D deficiency 09/29/2015  . Situational anxiety-flying 09/29/2015  . ETD (Eustachian tube dysfunction), bilateral 09/29/2015    Earlie Counts, PT 11/20/17 10:15 AM   [facility] [address] [place], [place], [address] Phone: [phone]   Fax:   [phone]  Name: Amy Burton MRN: [id number] Date of Birth: Oct 29, 1960

## 2017-11-26 ENCOUNTER — Encounter: Payer: Self-pay | Admitting: Physical Therapy

## 2017-11-26 ENCOUNTER — Ambulatory Visit: Payer: PRIVATE HEALTH INSURANCE | Attending: Family Medicine | Admitting: Physical Therapy

## 2017-11-26 DIAGNOSIS — M6281 Muscle weakness (generalized): Secondary | ICD-10-CM | POA: Diagnosis not present

## 2017-11-26 DIAGNOSIS — R252 Cramp and spasm: Secondary | ICD-10-CM | POA: Diagnosis present

## 2017-11-26 NOTE — Therapy (Signed)
[facility] [address], [place], [place], [address] Phone: [phone]   Fax:  [phone]  Physical Therapy Treatment  Patient Details  Name: Amy Burton MRN: [id number] Date of Birth: 27-Mar-1961 Referring Provider: Dr. Josefa Half   Encounter Date: 11/26/2017  PT End of Session - 11/26/17 1007    Visit Number  3    Date for PT Re-Evaluation  01/06/18    Authorization Type  Cigna    Authorization - Visit Number  3    Authorization - Number of Visits  28 2 already used this year    PT Start Time  0930    PT Stop Time  1008    PT Time Calculation (min)  38 min    Activity Tolerance  Patient tolerated treatment well    Behavior During Therapy  [facility] for tasks assessed/performed       Past Medical History:  Diagnosis Date  . Anxiety   . Colon polyp   . Depression   . Endometriosis   . Fibroid   . Thyroid disease     Past Surgical History:  Procedure Laterality Date  . APPENDECTOMY    . BREAST SURGERY    . CESAREAN SECTION    . endomitriosis  1995  . OOPHORECTOMY Right 1996  . SALPINGECTOMY Bilateral 1996    There were no vitals filed for this visit.  Subjective Assessment - 11/26/17 0935    Subjective  Every now and then I feel pressure.  I have increased water intake. I urinate 2-3 times per night instead of every hour. I have pressure but no vaginal pain. I do the abdominal massage daily. I splint in the vaginal to support the rectocele when having a bowel movement.     Patient Stated Goals  reduce pain and improve strength    Currently in Pain?  No/denies                    Pelvic Floor Special Questions - 11/26/17 0001    Pelvic Floor Internal Exam  patient confims identification and approves PT to assess pelvic floor muscle integrity    Exam Type  Vaginal    Palpation  restrictions on bil. sides of the urethra and rectum    Strength  good squeeze, good lift, able to hold  agaisnt strong resistance        [facility] Adult PT Treatment/Exercise - 11/26/17 0001      Manual Therapy   Manual Therapy  Internal Pelvic Floor    Myofascial Release  internal to work on sides of rectum and urethra sphincter               PT Short Term Goals - 11/26/17 4081      PT SHORT TERM GOAL #1   Title  independent with initial HEP with flexibility exercises    Time  4    Period  Weeks    Status  Achieved      PT SHORT TERM GOAL #2   Title  understand correct toileting technique to reduce strain on the pelvic floor    Time  4    Period  Weeks    Status  Achieved      PT SHORT TERM GOAL #3   Title  how to splint the pelvic floor to reduce rectocele while bathrooming    Time  4    Period  Weeks    Status  Achieved  PT SHORT TERM GOAL #4   Title  understand how to perform abdominal massage to promote improved bowel health and decrease strain on the pelvic floor    Time  4    Period  Weeks    Status  Achieved        PT Long Term Goals - 11/11/17 1206      PT LONG TERM GOAL #1   Title  independent with HEP and understand how to progress herself    Time  8    Period  Weeks    Status  New    Target Date  01/06/18      PT LONG TERM GOAL #2   Title  hold pelvic floor contraction for 10 seconds due to increased endurance of pelvic floor muscle strength    Time  8    Period  Weeks    Status  New    Target Date  01/06/18      PT LONG TERM GOAL #3   Title  pain in the vaginal area decreased >/= 75% due to improved tissue mobility and tenderness in the vaginal canal    Time  8    Period  Weeks    Status  New    Target Date  01/06/18      PT LONG TERM GOAL #4   Title  straining with a bowel movement decreased >/= 50% due to improved bowel health and coordination of the pelvic floor muscles    Time  8    Period  Weeks    Status  New    Target Date  01/06/18            Plan - 11/26/17 0939    Clinical Impression Statement  After manual  work, patient feels opening up and loser and the urge to urinate is gone.  Patient had restrictions and releases on both sides of the urethra and rectum.  Patient reports she is not going to the bathroom every hour at night instead 2-3 times per night.  Patient will benefit from skilled therapy to improve pelvic floor strength and endurance and education on management of rectocele.     Rehab Potential  Excellent    Clinical Impairments Affecting Rehab Potential  s/p laproscopic surgery for endometriosis    PT Frequency  1x / week    PT Duration  8 weeks    PT Treatment/Interventions  Biofeedback;Cryotherapy;Electrical Stimulation;Moist Heat;Ultrasound;Patient/family education;Neuromuscular re-education;Therapeutic exercise;Therapeutic activities;Manual techniques;Dry needling    PT Next Visit Plan  pelvic floor strength, soft tissue work to the left pelvic floor,  core strength; lifting correctly    PT Home Exercise Plan  progress as needed    Consulted and Agree with Plan of Care  Patient       Patient will benefit from skilled therapeutic intervention in order to improve the following deficits and impairments:  Pain, Decreased strength, Decreased activity tolerance, Increased fascial restricitons, Increased muscle spasms  Visit Diagnosis: Muscle weakness (generalized)  Cramp and spasm     Problem List Patient Active Problem List   Diagnosis Date Noted  . Dysuria 10/22/2017  . Nonallopathic lesion of thoracic region 05/15/2017  . Nonallopathic lesion of cervical region 05/15/2017  . Nonallopathic lesion of sacral region 05/15/2017  . Chronic neck pain 04/30/2017  . Chronic back pain 04/30/2017  . Arthralgia of hip 04/30/2017  . Pelvic prolapse 04/30/2017  . 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 Eczema 09/07/2016  . Urinary urgency 09/07/2016  .  Chronic sinusitis 09/29/2015  . Allergic rhinitis 09/29/2015  . Prediabetes 09/29/2015  . Vitamin D deficiency 09/29/2015  .  Situational anxiety-flying 09/29/2015  . ETD (Eustachian tube dysfunction), bilateral 09/29/2015    Earlie Counts, PT 11/26/17 10:11 AM   [facility] [address] [place], [place], [address] Phone: [phone]   Fax:  [phone]  Name: Amy Burton MRN: [id number] Date of Birth: May 21, 1961

## 2017-11-26 NOTE — Progress Notes (Signed)
[facility] [place], [place] [address] Phone: [phone] Subjective:    I'm seeing this patient by the request  of:    CC: Back pain follow-up  DEY:[id number]  Amy Burton is a 57 y.o. female coming in with complaint of back pain.  Slipped rib syndrome.  Working on posture.  Has responded well to osteopathic manipulation.  Mild increase in discomfort recently especially in the neck.  Known arthritic changes of the cervical spine noted.     Past Medical History:  Diagnosis Date  . Anxiety   . Colon polyp   . Depression   . Endometriosis   . Fibroid   . Thyroid disease    Past Surgical History:  Procedure Laterality Date  . APPENDECTOMY    . BREAST SURGERY    . CESAREAN SECTION    . endomitriosis  1995  . OOPHORECTOMY Right 1996  . SALPINGECTOMY Bilateral 1996   Social History   Socioeconomic History  . Marital status: Married    Spouse name: Not on file  . Number of children: Not on file  . Years of education: Not on file  . Highest education level: Not on file  Occupational History  . Not on file  Social Needs  . Financial resource strain: Not on file  . Food insecurity:    Worry: Not on file    Inability: Not on file  . Transportation needs:    Medical: Not on file    Non-medical: Not on file  Tobacco Use  . Smoking status: Never Smoker  . Smokeless tobacco: Never Used  Substance and Sexual Activity  . Alcohol use: Yes    Comment: occ  . Drug use: No  . Sexual activity: Yes    Birth control/protection: Surgical    Comment: Salpingectomy/Oophorectomy   Lifestyle  . Physical activity:    Days per week: Not on file    Minutes per session: Not on file  . Stress: Not on file  Relationships  . Social connections:    Talks on phone: Not on file    Gets together: Not on file    Attends religious service: Not on file    Active member of club or organization: Not on file    Attends meetings of clubs or  organizations: Not on file    Relationship status: Not on file  Other Topics Concern  . Not on file  Social History Narrative  . Not on file   Allergies  Allergen Reactions  . Sulfa Antibiotics    Family History  Problem Relation Age of Onset  . Hyperlipidemia Mother   . Hypertension Mother   . Diabetes Father   . Prostate cancer Father   . Arthritis Sister   . Rheum arthritis Sister   . Heart disease Brother   . Heart attack Brother   . Cancer Maternal Grandmother        breast     Past medical history, social, surgical and family history all reviewed in electronic medical record.  No pertanent information unless stated regarding to the chief complaint.   Review of Systems:Review of systems updated and as accurate as of 11/27/17  No headache, visual changes, nausea, vomiting, diarrhea, constipation, dizziness, abdominal pain, skin rash, fevers, chills, night sweats, weight loss, swollen lymph nodes, body aches, joint swelling, chest pain, shortness of breath, mood changes.  Positive muscle aches  Objective  Blood pressure 110/80, pulse 75, height 5\' 4"  (1.626  m), weight 173 lb (78.5 kg), last menstrual period 10/26/2015, SpO2 98 %. Systems examined below as of 11/27/17   General: No apparent distress alert and oriented x3 mood and affect normal, dressed appropriately.  HEENT: Pupils equal, extraocular movements intact  Respiratory: Patient's speak in full sentences and does not appear short of breath  Cardiovascular: No lower extremity edema, non tender, no erythema  Skin: Warm dry intact with no signs of infection or rash on extremities or on axial skeleton.  Abdomen: Soft nontender  Neuro: Cranial nerves II through XII are intact, neurovascularly intact in all extremities with 2+ DTRs and 2+ pulses.  Lymph: No lymphadenopathy of posterior or anterior cervical chain or axillae bilaterally.  Gait normal with good balance and coordination.  MSK:  Non tender with full range  of motion and good stability and symmetric strength and tone of shoulders, elbows, wrist, hip, knee and ankles bilaterally.  Neck exam does show some decreased range of motion in all planes with tightness.  Negative Spurling's test bilaterally.    Back Exam:  Inspection: Unremarkable  Motion: Flexion 45 deg, Extension 25 deg, Side Bending to 45 deg bilaterally,  Rotation to 45 deg bilaterally  SLR laying: Negative  XSLR laying: Negative  Palpable tenderness: Mild tightness noted in the thoracolumbar juncture.Marland Kitchen FABER: negative. Sensory change: Gross sensation intact to all lumbar and sacral dermatomes.  Reflexes: 2+ at both patellar tendons, 2+ at achilles tendons, Babinski's downgoing.  Strength at foot  Plantar-flexion: 5/5 Dorsi-flexion: 5/5 Eversion: 5/5 Inversion: 5/5  Leg strength  Quad: 5/5 Hamstring: 5/5 Hip flexor: 5/5 Hip abductors: 5/5  Gait unremarkable.   Osteopathic findings C4 flexed rotated and side bent left T3 extended rotated and side bent right inhaled third rib L2 flexed rotated and side bent right Sacrum right on right  Impression and Recommendations:     This case required medical decision making of moderate complexity.      Note: This dictation was prepared with Dragon dictation along with smaller phrase technology. Any transcriptional errors that result from this process are unintentional.

## 2017-11-27 ENCOUNTER — Encounter: Payer: Self-pay | Admitting: Family Medicine

## 2017-11-27 ENCOUNTER — Ambulatory Visit (INDEPENDENT_AMBULATORY_CARE_PROVIDER_SITE_OTHER): Payer: PRIVATE HEALTH INSURANCE | Admitting: Family Medicine

## 2017-11-27 VITALS — BP 110/80 | HR 75 | Ht 64.0 in | Wt 173.0 lb

## 2017-11-27 DIAGNOSIS — M542 Cervicalgia: Secondary | ICD-10-CM | POA: Diagnosis not present

## 2017-11-27 DIAGNOSIS — G8929 Other chronic pain: Secondary | ICD-10-CM | POA: Diagnosis not present

## 2017-11-27 DIAGNOSIS — M999 Biomechanical lesion, unspecified: Secondary | ICD-10-CM | POA: Diagnosis not present

## 2017-11-27 NOTE — Assessment & Plan Note (Signed)
Underlying arthritis.  Has responded well to osteopathic manipulation.  Discussed icing regimen and home exercises.  Discussed which activities to doing which wants to avoid.  Patient was to increase activity slowly over the course the next several days.  Patient will follow up with me again 4 weeks

## 2017-11-27 NOTE — Patient Instructions (Signed)
Good to see you  Alvera Singh is your friend.  Keep doing the exercises  Consider biking for now [place] could be a good option  Luis Abed could be a good doc if you move.  See me again in 4 week s

## 2017-11-27 NOTE — Assessment & Plan Note (Signed)
Decision today to treat with OMT was based on Physical Exam  After verbal consent patient was treated with HVLA, ME, FPR techniques in cervical, thoracic, lumbar and sacral areas  Patient tolerated the procedure well with improvement in symptoms  Patient given exercises, stretches and lifestyle modifications  See medications in patient instructions if given  Patient will follow up in 4-6 weeks 

## 2017-11-28 ENCOUNTER — Ambulatory Visit: Payer: PRIVATE HEALTH INSURANCE | Admitting: Obstetrics and Gynecology

## 2017-12-03 ENCOUNTER — Encounter: Payer: PRIVATE HEALTH INSURANCE | Admitting: Physical Therapy

## 2017-12-05 ENCOUNTER — Telehealth: Payer: Self-pay | Admitting: Obstetrics and Gynecology

## 2017-12-05 NOTE — Telephone Encounter (Signed)
Patient called stating that she has decided to give the pessary another try. Stated that she would like to ask nurse a few questions about it. Stated that she is on Day 3 of use. Rescheduled recheck for 12/16/17.

## 2017-12-05 NOTE — Telephone Encounter (Signed)
Spoke with patient. Advised of message as seen below from [place]. Patient verbalizes understanding. Patient has set a follow up for 12/16/2017. Encounter closed.

## 2017-12-05 NOTE — Telephone Encounter (Signed)
Spoke with patient. Patient states that she has decided to try her pessary again. Placed pessary 3 days ago herself and reports it feels great. Has a few questions for Dr.Silva.  States she read that a pessary can push on the rectocele and make this worse. Asking if this is correct.   Also asking how to know if it is correctly placed.   Patient reports she has large dense breasts. Is having trouble getting fitted for the right bra. Jeanelle Malling she has are rubbing a callas on one of her breasts. Asking where she can get fitted for a better bra.

## 2017-12-05 NOTE — Telephone Encounter (Signed)
A pessary should not make a rectocele worse.  It is designed to make it better.  Pessaries treat pelvic organ prolapse of the bladder, uterus, vaginal vault, and rectum.  If the pessary is not falling out, she has placed it properly.   She can Google "bra fitting near me" in the Internet to find a location for this. I have a patient who has done this in [place].  I do not know the name, but she may want to call [facility] if she wishes to have this done in [place].

## 2017-12-10 ENCOUNTER — Ambulatory Visit: Payer: PRIVATE HEALTH INSURANCE | Admitting: Physical Therapy

## 2017-12-10 ENCOUNTER — Telehealth: Payer: Self-pay | Admitting: Physical Therapy

## 2017-12-10 NOTE — Telephone Encounter (Signed)
Called patient and left message due to her no showing for 9:30 appointment on 12/10/2017.  Earlie Counts, PT @4 /16/2019@ 9:45 AM

## 2017-12-16 ENCOUNTER — Encounter: Payer: Self-pay | Admitting: Obstetrics and Gynecology

## 2017-12-16 ENCOUNTER — Other Ambulatory Visit: Payer: Self-pay

## 2017-12-16 ENCOUNTER — Ambulatory Visit (INDEPENDENT_AMBULATORY_CARE_PROVIDER_SITE_OTHER): Payer: PRIVATE HEALTH INSURANCE | Admitting: Obstetrics and Gynecology

## 2017-12-16 VITALS — BP 110/60 | HR 76 | Resp 16 | Ht 64.0 in | Wt 173.0 lb

## 2017-12-16 DIAGNOSIS — N6081 Other benign mammary dysplasias of right breast: Secondary | ICD-10-CM

## 2017-12-16 DIAGNOSIS — Z4689 Encounter for fitting and adjustment of other specified devices: Secondary | ICD-10-CM

## 2017-12-16 DIAGNOSIS — N816 Rectocele: Secondary | ICD-10-CM | POA: Diagnosis not present

## 2017-12-16 NOTE — Progress Notes (Signed)
GYNECOLOGY  VISIT   HPI: 57 y.o.   Married  Caucasian  female   [phone] with Patient's last menstrual period was 10/26/2015 (within months).   here for pessary recheck.  Using a #4 ring with support pessary.  Pessary is out more than it is in.  Feels the pessary is shifting.  Can feel pinching at times.  Sometimes it feels comfortable and sometimes it is uncomfortable.  Has second degree rectocele.  Area of breast irritation.  Wants to have a bra fitting.  Has an area on right breast she wants me to check.   Father just died 3 days ago. Selling and buying a house.  Husband is changing her job.   GYNECOLOGIC HISTORY: Patient's last menstrual period was 10/26/2015 (within months). Contraception:  Tubes and ovaries removed/postmenopsausal Menopausal hormone therapy:  none Last mammogram:  Per patient does thermography every year Last pap smear:   01/24/17 Negative        OB History    Gravida  5   Para  3   Term  0   Preterm  0   AB  1   Living  3     SAB  1   TAB  0   Ectopic  0   Multiple  0   Live Births  0              Patient Active Problem List   Diagnosis Date Noted  . Dysuria 10/22/2017  . Nonallopathic lesion of thoracic region 05/15/2017  . Nonallopathic lesion of cervical region 05/15/2017  . Nonallopathic lesion of sacral region 05/15/2017  . Chronic neck pain 04/30/2017  . Chronic back pain 04/30/2017  . Arthralgia of hip 04/30/2017  . Pelvic prolapse 04/30/2017  . 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 Eczema 09/07/2016  . Urinary urgency 09/07/2016  . Chronic sinusitis 09/29/2015  . Allergic rhinitis 09/29/2015  . Prediabetes 09/29/2015  . Vitamin D deficiency 09/29/2015  . Situational anxiety-flying 09/29/2015  . ETD (Eustachian tube dysfunction), bilateral 09/29/2015    Past Medical History:  Diagnosis Date  . Anxiety   . Colon polyp   . Depression   . Endometriosis   . Fibroid   . Thyroid disease     Past Surgical  History:  Procedure Laterality Date  . APPENDECTOMY    . BREAST SURGERY    . CESAREAN SECTION    . endomitriosis  1995  . OOPHORECTOMY Right 1996  . SALPINGECTOMY Bilateral 1996    Current Outpatient Medications  Medication Sig Dispense Refill  . ALPRAZolam (XANAX) 0.25 MG tablet Take 1 tablet (0.25 mg total) by mouth daily as needed for anxiety (For Dental work and traveling). 30 tablet 0  . Ascorbic Acid (VITAMIN C) 1000 MG tablet Take 1,000 mg by mouth daily.    . B Complex Vitamins (VITAMIN B COMPLEX PO) Take by mouth.    . cetirizine (ZYRTEC) 10 MG tablet Take 1 tablet (10 mg total) by mouth daily. 30 tablet 11  . CHROMIUM PO Take 2 tablets by mouth daily.    . Cyanocobalamin (VITAMIN B-12 PO) Take 5,000 mg by mouth daily.     Marland Kitchen ibuprofen (ADVIL,MOTRIN) 600 MG tablet Take 600 mg by mouth every 6 (six) hours as needed.    . TURMERIC PO Take by mouth daily.     No current facility-administered medications for this visit.      ALLERGIES: Sulfa antibiotics  Family History  Problem Relation Age of Onset  .  Hyperlipidemia Mother   . Hypertension Mother   . Diabetes Father   . Prostate cancer Father   . Arthritis Sister   . Rheum arthritis Sister   . Heart disease Brother   . Heart attack Brother   . Cancer Maternal Grandmother        breast    Social History   Socioeconomic History  . Marital status: Married    Spouse name: Not on file  . Number of children: Not on file  . Years of education: Not on file  . Highest education level: Not on file  Occupational History  . Not on file  Social Needs  . Financial resource strain: Not on file  . Food insecurity:    Worry: Not on file    Inability: Not on file  . Transportation needs:    Medical: Not on file    Non-medical: Not on file  Tobacco Use  . Smoking status: Never Smoker  . Smokeless tobacco: Never Used  Substance and Sexual Activity  . Alcohol use: Yes    Comment: occ  . Drug use: No  . Sexual  activity: Yes    Birth control/protection: Surgical    Comment: Salpingectomy/Oophorectomy   Lifestyle  . Physical activity:    Days per week: Not on file    Minutes per session: Not on file  . Stress: Not on file  Relationships  . Social connections:    Talks on phone: Not on file    Gets together: Not on file    Attends religious service: Not on file    Active member of club or organization: Not on file    Attends meetings of clubs or organizations: Not on file    Relationship status: Not on file  . Intimate partner violence:    Fear of current or ex partner: Not on file    Emotionally abused: Not on file    Physically abused: Not on file    Forced sexual activity: Not on file  Other Topics Concern  . Not on file  Social History Narrative  . Not on file    ROS:  Pertinent items are noted in HPI.  PHYSICAL EXAMINATION:    BP 110/60 (BP Location: Right Arm, Patient Position: Sitting, Cuff Size: Normal)   Pulse 76   Resp 16   Ht 5\' 4"  (1.626 m)   Wt 173 lb (78.5 kg)   LMP 10/26/2015 (Within Months)   BMI 29.70 kg/m     General appearance: alert, cooperative and appears stated age  Right breast with 3 mm sebaceous cyst.   Pelvic: External genitalia:  no lesions              Urethra:  normal appearing urethra with no masses, tenderness or lesions              Bartholins and Skenes: normal                 Vagina: normal appearing vagina with normal color and discharge, no lesions              Cervix: no lesions                Bimanual Exam:  Uterus:  normal size, contour, position, consistency, mobility, non-tender              Adnexa: no mass, fullness, tenderness              Pessary removed, cleansed, and replaced.  Chaperone was present for exam.  ASSESSMENT  Rectocele.  Pessary maintenance. Sebaceous cyst of the breast.  Recent death of father.   PLAN  Continue pessary care prn.  Discussed sebaceous cyst. Support given for the loss of her father.   Keep annual exam appointment.    An After Visit Summary was printed and given to the patient.  __15____ minutes face to face time of which over 50% was spent in counseling.

## 2017-12-23 ENCOUNTER — Encounter: Payer: Self-pay | Admitting: Physical Therapy

## 2017-12-23 ENCOUNTER — Ambulatory Visit: Payer: PRIVATE HEALTH INSURANCE | Admitting: Physical Therapy

## 2017-12-23 DIAGNOSIS — M6281 Muscle weakness (generalized): Secondary | ICD-10-CM

## 2017-12-23 DIAGNOSIS — R252 Cramp and spasm: Secondary | ICD-10-CM

## 2017-12-23 NOTE — Therapy (Signed)
[facility] [address], [place], [place], [address] Phone: [phone]   Fax:  [phone]  Physical Therapy Treatment  Patient Details  Name: Amy Burton MRN: [id number] Date of Birth: [birth year] Referring Provider: Dr. Josefa Half   Encounter Date: 12/23/2017  PT End of Session - 12/23/17 0850    Visit Number  4    Date for PT Re-Evaluation  01/06/18    Authorization Type  [employer]    Authorization - Visit Number  4    Authorization - Number of Visits  28 2 already used    PT Start Time  0850    PT Stop Time  0928    PT Time Calculation (min)  38 min    Activity Tolerance  Patient tolerated treatment well    Behavior During Therapy  [facility] for tasks assessed/performed       Past Medical History:  Diagnosis Date  . Anxiety   . Colon polyp   . Depression   . Endometriosis   . Fibroid   . Thyroid disease     Past Surgical History:  Procedure Laterality Date  . APPENDECTOMY    . BREAST SURGERY    . CESAREAN SECTION    . endomitriosis  1995  . OOPHORECTOMY Right 1996  . SALPINGECTOMY Bilateral 1996    There were no vitals filed for this visit.  Subjective Assessment - 12/23/17 0851    Subjective  I have not had issues.  I have been cautious with preparing my house to move. When I had a cold and I felt a difference and used my pessary. I use the pessary when I need it. I had trouble doing the wand to massage the pelvic floor musclse.     Patient Stated Goals  reduce pain and improve strength    Currently in Pain?  No/denies    Multiple Pain Sites  No                       [facility] Adult PT Treatment/Exercise - 12/23/17 0001      Manual Therapy   Manual Therapy  Soft tissue mobilization;Myofascial release    Soft tissue mobilization  lower abdominal region and along the scar and releast the tissue    Myofascial Release  suprapubic area and lower abdominal area to release through the  3 planes of fascia               PT Short Term Goals - 11/26/17 6962      PT SHORT TERM GOAL #1   Title  independent with initial HEP with flexibility exercises    Time  4    Period  Weeks    Status  Achieved      PT SHORT TERM GOAL #2   Title  understand correct toileting technique to reduce strain on the pelvic floor    Time  4    Period  Weeks    Status  Achieved      PT SHORT TERM GOAL #3   Title  how to splint the pelvic floor to reduce rectocele while bathrooming    Time  4    Period  Weeks    Status  Achieved      PT SHORT TERM GOAL #4   Title  understand how to perform abdominal massage to promote improved bowel health and decrease strain on the pelvic floor    Time  4  Period  Weeks    Status  Achieved        PT Long Term Goals - 11/11/17 1206      PT LONG TERM GOAL #1   Title  independent with HEP and understand how to progress herself    Time  8    Period  Weeks    Status  New    Target Date  01/06/18      PT LONG TERM GOAL #2   Title  hold pelvic floor contraction for 10 seconds due to increased endurance of pelvic floor muscle strength    Time  8    Period  Weeks    Status  New    Target Date  01/06/18      PT LONG TERM GOAL #3   Title  pain in the vaginal area decreased >/= 75% due to improved tissue mobility and tenderness in the vaginal canal    Time  8    Period  Weeks    Status  New    Target Date  01/06/18      PT LONG TERM GOAL #4   Title  straining with a bowel movement decreased >/= 50% due to improved bowel health and coordination of the pelvic floor muscles    Time  8    Period  Weeks    Status  New    Target Date  01/06/18            Plan - 12/23/17 6063    Clinical Impression Statement  Patient is doing well with the pessary and uses as needed.  Patient has no urinary leakage.  Patient feels some protruding when coughing but will use the pessary. Patient has some tightness along the lower abdominal scar and  after maual work had increased mobilty. Patient will have one more session to review how to use the wand internally.     Rehab Potential  Excellent    Clinical Impairments Affecting Rehab Potential  s/p laproscopic surgery for endometriosis    PT Frequency  1x / week    PT Duration  8 weeks    PT Treatment/Interventions  Biofeedback;Cryotherapy;Electrical Stimulation;Moist Heat;Ultrasound;Patient/family education;Neuromuscular re-education;Therapeutic exercise;Therapeutic activities;Manual techniques;Dry needling    PT Next Visit Plan  discharge and go over on how to use the wand    PT Home Exercise Plan  progress as needed    Consulted and Agree with Plan of Care  Patient       Patient will benefit from skilled therapeutic intervention in order to improve the following deficits and impairments:  Pain, Decreased strength, Decreased activity tolerance, Increased fascial restricitons, Increased muscle spasms  Visit Diagnosis: Muscle weakness (generalized)  Cramp and spasm     Problem List Patient Active Problem List   Diagnosis Date Noted  . Dysuria 10/22/2017  . Nonallopathic lesion of thoracic region 05/15/2017  . Nonallopathic lesion of cervical region 05/15/2017  . Nonallopathic lesion of sacral region 05/15/2017  . Chronic neck pain 04/30/2017  . Chronic back pain 04/30/2017  . Arthralgia of hip 04/30/2017  . Pelvic prolapse 04/30/2017  . 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 Eczema 09/07/2016  . Urinary urgency 09/07/2016  . Chronic sinusitis 09/29/2015  . Allergic rhinitis 09/29/2015  . Prediabetes 09/29/2015  . Vitamin D deficiency 09/29/2015  . Situational anxiety-flying 09/29/2015  . ETD (Eustachian tube dysfunction), bilateral 09/29/2015    Earlie Counts, PT 12/23/17 9:31 AM    [facility] [address]  [place], [place], [place], [address] Phone: [phone]   Fax:  [phone]  Name: Amy Burton MRN: [id number] Date of Birth: 1961/08/15

## 2017-12-25 ENCOUNTER — Ambulatory Visit (INDEPENDENT_AMBULATORY_CARE_PROVIDER_SITE_OTHER): Payer: BLUE CROSS/BLUE SHIELD | Admitting: Family Medicine

## 2017-12-25 ENCOUNTER — Encounter: Payer: Self-pay | Admitting: Family Medicine

## 2017-12-25 VITALS — BP 120/80 | HR 74 | Ht 64.0 in | Wt 173.0 lb

## 2017-12-25 DIAGNOSIS — M999 Biomechanical lesion, unspecified: Secondary | ICD-10-CM | POA: Diagnosis not present

## 2017-12-25 DIAGNOSIS — G8929 Other chronic pain: Secondary | ICD-10-CM

## 2017-12-25 DIAGNOSIS — M542 Cervicalgia: Secondary | ICD-10-CM | POA: Diagnosis not present

## 2017-12-25 NOTE — Progress Notes (Signed)
[facility] [address] [place], [place] [address] Phone: [phone] Subjective:    I'm seeing this patient by the request  of:    CC: Neck pain  GYI:[id number]  Amy Burton is a 57 y.o. female coming in with complaint of neck pain. She has been busy as she is going to be moving. Her neck is bothering her on the left side. She does feel some numbness in the left arm. She has been working on her posture.      Past Medical History:  Diagnosis Date  . Anxiety   . Colon polyp   . Depression   . Endometriosis   . Fibroid   . Thyroid disease    Past Surgical History:  Procedure Laterality Date  . APPENDECTOMY    . BREAST SURGERY    . CESAREAN SECTION    . endomitriosis  1995  . OOPHORECTOMY Right 1996  . SALPINGECTOMY Bilateral 1996   Social History   Socioeconomic History  . Marital status: Married    Spouse name: Not on file  . Number of children: Not on file  . Years of education: Not on file  . Highest education level: Not on file  Occupational History  . Not on file  Social Needs  . Financial resource strain: Not on file  . Food insecurity:    Worry: Not on file    Inability: Not on file  . Transportation needs:    Medical: Not on file    Non-medical: Not on file  Tobacco Use  . Smoking status: Never Smoker  . Smokeless tobacco: Never Used  Substance and Sexual Activity  . Alcohol use: Yes    Comment: occ  . Drug use: No  . Sexual activity: Yes    Birth control/protection: Surgical    Comment: Salpingectomy/Oophorectomy   Lifestyle  . Physical activity:    Days per week: Not on file    Minutes per session: Not on file  . Stress: Not on file  Relationships  . Social connections:    Talks on phone: Not on file    Gets together: Not on file    Attends religious service: Not on file    Active member of club or organization: Not on file    Attends meetings of clubs or organizations: Not on file    Relationship  status: Not on file  Other Topics Concern  . Not on file  Social History Narrative  . Not on file   Allergies  Allergen Reactions  . Sulfa Antibiotics    Family History  Problem Relation Age of Onset  . Hyperlipidemia Mother   . Hypertension Mother   . Diabetes Father   . Prostate cancer Father   . Arthritis Sister   . Rheum arthritis Sister   . Heart disease Brother   . Heart attack Brother   . Cancer Maternal Grandmother        breast     Past medical history, social, surgical and family history all reviewed in electronic medical record.  No pertanent information unless stated regarding to the chief complaint.   Review of Systems:Review of systems updated and as accurate as of 12/25/17  No headache, visual changes, nausea, vomiting, diarrhea, constipation, dizziness, abdominal pain, skin rash, fevers, chills, night sweats, weight loss, swollen lymph nodes, body aches, joint swelling, muscle aches, chest pain, shortness of breath, mood changes.   Objective  Blood pressure 120/80, pulse 74, height 5\' 4"  (  1.626 m), weight 173 lb (78.5 kg), last menstrual period 10/26/2015, SpO2 97 %. Systems examined below as of 12/25/17   General: No apparent distress alert and oriented x3 mood and affect normal, dressed appropriately.  HEENT: Pupils equal, extraocular movements intact  Respiratory: Patient's speak in full sentences and does not appear short of breath  Cardiovascular: No lower extremity edema, non tender, no erythema  Skin: Warm dry intact with no signs of infection or rash on extremities or on axial skeleton.  Abdomen: Soft nontender  Neuro: Cranial nerves II through XII are intact, neurovascularly intact in all extremities with 2+ DTRs and 2+ pulses.  Lymph: No lymphadenopathy of posterior or anterior cervical chain or axillae bilaterally.  Gait normal with good balance and coordination.  MSK:  Non tender with full range of motion and good stability and symmetric strength  and tone of shoulders, elbows, wrist, hip, knee and ankles bilaterally.  Neck: Inspection loss of lordosis. No palpable stepoffs. He does have some limitation in range of motion lacking last 5 to 10 degrees of extension and sidebending bilaterally.  Negative Spurling's. Grip strength and sensation normal in bilateral hands Strength good C4 to T1 distribution No sensory change to C4 to T1 Negative Hoffman sign bilaterally Reflexes normal  Tightness of the trapezius bilaterally  Osteopathic findings C2 flexed rotated and side bent right C4 flexed rotated and side bent left C7 flexed rotated and side bent left T3 extended rotated and side bent right inhaled third rib T9 extended rotated and side bent left L2 flexed rotated and side bent right Sacrum right on right  Impression and Recommendations:     This case required medical decision making of moderate complexity.      Note: This dictation was prepared with Dragon dictation along with smaller phrase technology. Any transcriptional errors that result from this process are unintentional.

## 2017-12-25 NOTE — Assessment & Plan Note (Signed)
Decision today to treat with OMT was based on Physical Exam  After verbal consent patient was treated with HVLA, ME, FPR techniques in cervical, thoracic, lumbar and sacral areas  Patient tolerated the procedure well with improvement in symptoms  Patient given exercises, stretches and lifestyle modifications  See medications in patient instructions if given  Patient will follow up in 4-8 weeks 

## 2017-12-25 NOTE — Assessment & Plan Note (Signed)
Still from posterior muscle imbalances.  Patient does have a significant amount of breast tissue that could be contributing as well.  We discussed icing regimen and home exercise.  Patient will be moving to [place] in the near future.  Patient does not want to do anything more aggressive such as injections.  Follow-up again in 4 to 8 weeks

## 2017-12-25 NOTE — Patient Instructions (Signed)
Good to see you  Amy Burton is your friend.  Have a good move! See me again in 4-6 weeks [phone]

## 2018-01-02 ENCOUNTER — Ambulatory Visit: Payer: BLUE CROSS/BLUE SHIELD | Attending: Family Medicine | Admitting: Physical Therapy

## 2018-01-02 ENCOUNTER — Encounter: Payer: Self-pay | Admitting: Physical Therapy

## 2018-01-02 DIAGNOSIS — R252 Cramp and spasm: Secondary | ICD-10-CM | POA: Diagnosis not present

## 2018-01-02 DIAGNOSIS — M6281 Muscle weakness (generalized): Secondary | ICD-10-CM

## 2018-01-02 NOTE — Therapy (Signed)
[facility] [address]  [place], [place], [address] Phone: [phone]   Fax:  [phone]  Physical Therapy Treatment  Patient Details  Name: Amy Burton MRN: [id number] Date of Birth: June 21, 1961 Referring Provider: Dr. Josefa Half   Encounter Date: 01/02/2018  PT End of Session - 01/02/18 0932    Visit Number  4    Date for PT Re-Evaluation  01/06/18    Authorization Type  [employer]    Authorization - Visit Number  5    Authorization - Number of Visits  28    PT Start Time  0930    PT Stop Time  1000 was ready to leave    PT Time Calculation (min)  30 min    Activity Tolerance  Patient tolerated treatment well    Behavior During Therapy  [facility] for tasks assessed/performed       Past Medical History:  Diagnosis Date  . Anxiety   . Colon polyp   . Depression   . Endometriosis   . Fibroid   . Thyroid disease     Past Surgical History:  Procedure Laterality Date  . APPENDECTOMY    . BREAST SURGERY    . CESAREAN SECTION    . endomitriosis  1995  . OOPHORECTOMY Right 1996  . SALPINGECTOMY Bilateral 1996    There were no vitals filed for this visit.  Subjective Assessment - 01/02/18 0932    Subjective  I have been using the wand and able to figure it out.      Patient Stated Goals  reduce pain and improve strength    Currently in Pain?  No/denies         [facility] PT Assessment - 01/02/18 0001      Assessment   Medical Diagnosis  N81.6 Rectocele    Referring Provider  Dr. Josefa Half    Onset Date/Surgical Date  10/28/17    Prior Therapy  yes      Precautions   Precautions  None      Restrictions   Weight Bearing Restrictions  No      Home Environment   Living Environment  Private residence      Prior Function   Level of Independence  Independent    Leisure  standing to paint; sewing;       Cognition   Overall Cognitive Status  Within Functional Limits for tasks assessed       Posture/Postural Control   Posture/Postural Control  No significant limitations      ROM / Strength   AROM / PROM / Strength  AROM;PROM;Strength      AROM   Overall AROM   Within functional limits for tasks performed      PROM   Overall PROM   Within functional limits for tasks performed      Strength   Right Hip External Rotation   5/5    Right Hip Internal Rotation  5/5    Right Hip ABduction  5/5    Left Hip Flexion  5/5    Left Hip External Rotation  5/5    Left Hip Internal Rotation  5/5    Left Hip ABduction  5/5      Palpation   SI assessment   pelvis in correct alignment                Pelvic Floor Special Questions - 01/02/18 0001    Pelvic Floor Internal Exam  patient confims  identification and approves PT to assess pelvic floor muscle integrity    Exam Type  Vaginal    Palpation  --    Strength  good squeeze, good lift, able to hold agaisnt strong resistance    Strength # of seconds  10        [facility] Adult PT Treatment/Exercise - 01/02/18 0001      Self-Care   Self-Care  Other Self-Care Comments    Other Self-Care Comments   reviewed ways to manage a rectocele      Manual Therapy   Manual Therapy  Internal Pelvic Floor    Internal Pelvic Floor  bil. obturator internist, bil. levator ani and posterior to the left of the vaginal canal             PT Education - 01/02/18 1002    Education provided  Yes    Education Details  reviewed ways to manage a rectocele    Person(s) Educated  Patient    Methods  Explanation    Comprehension  Verbalized understanding       PT Short Term Goals - 11/26/17 0937      PT SHORT TERM GOAL #1   Title  independent with initial HEP with flexibility exercises    Time  4    Period  Weeks    Status  Achieved      PT SHORT TERM GOAL #2   Title  understand correct toileting technique to reduce strain on the pelvic floor    Time  4    Period  Weeks    Status  Achieved      PT SHORT TERM GOAL #3   Title   how to splint the pelvic floor to reduce rectocele while bathrooming    Time  4    Period  Weeks    Status  Achieved      PT SHORT TERM GOAL #4   Title  understand how to perform abdominal massage to promote improved bowel health and decrease strain on the pelvic floor    Time  4    Period  Weeks    Status  Achieved        PT Long Term Goals - 01/02/18 5852      PT LONG TERM GOAL #1   Title  independent with HEP and understand how to progress herself    Time  8    Period  Weeks    Status  Achieved      PT LONG TERM GOAL #2   Title  hold pelvic floor contraction for 10 seconds due to increased endurance of pelvic floor muscle strength    Time  8    Period  Weeks    Status  Achieved      PT LONG TERM GOAL #3   Title  pain in the vaginal area decreased >/= 75% due to improved tissue mobility and tenderness in the vaginal canal    Time  8    Status  Achieved      PT LONG TERM GOAL #4   Title  straining with a bowel movement decreased >/= 50% due to improved bowel health and coordination of the pelvic floor muscles    Period  Weeks    Status  Achieved            Plan - 01/02/18 0936    Clinical Impression Statement  Patient has met all of her goal.  Patient has full strength of bilateral  lower extremity strength. Patient will feel her prolapse is she lifts more than 10 pounds so she is limiting herself to manage the prolapse.  Patient pelvic floor strength is 4/5. Minimal tightness in the left pelvic floor muscles and she is using a wand to reduce trigger points in the pelvic floor. Patient is independent with her HEP and ready for discharge.     Rehab Potential  Excellent    Clinical Impairments Affecting Rehab Potential  s/p laproscopic surgery for endometriosis    PT Treatment/Interventions  Biofeedback;Cryotherapy;Electrical Stimulation;Moist Heat;Ultrasound;Patient/family education;Neuromuscular re-education;Therapeutic exercise;Therapeutic activities;Manual  techniques;Dry needling    PT Next Visit Plan  discharge to HEP this visit    PT Home Exercise Plan  Current HEP    Consulted and Agree with Plan of Care  Patient       Patient will benefit from skilled therapeutic intervention in order to improve the following deficits and impairments:  Pain, Decreased strength, Decreased activity tolerance, Increased fascial restricitons, Increased muscle spasms  Visit Diagnosis: Muscle weakness (generalized)  Cramp and spasm     Problem List Patient Active Problem List   Diagnosis Date Noted  . Dysuria 10/22/2017  . Nonallopathic lesion of thoracic region 05/15/2017  . Nonallopathic lesion of cervical region 05/15/2017  . Nonallopathic lesion of sacral region 05/15/2017  . Chronic neck pain 04/30/2017  . Chronic back pain 04/30/2017  . Arthralgia of hip 04/30/2017  . Pelvic prolapse 04/30/2017  . 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 Eczema 09/07/2016  . Urinary urgency 09/07/2016  . Chronic sinusitis 09/29/2015  . Allergic rhinitis 09/29/2015  . Prediabetes 09/29/2015  . Vitamin D deficiency 09/29/2015  . Situational anxiety-flying 09/29/2015  . ETD (Eustachian tube dysfunction), bilateral 09/29/2015    Earlie Counts, PT 01/02/18 10:07 AM   [facility] [address] [place], [place], [address] Phone: [phone]   Fax:  [phone]  Name: Amy Burton MRN: [id number] Date of Birth: 20-Dec-1960  PHYSICAL THERAPY DISCHARGE SUMMARY  Visits from Start of Care: 4  Current functional level related to goals / functional outcomes: See above.    Remaining deficits: See above.   Education / Equipment: HEP Plan: Patient agrees to discharge.  Patient goals were met. Patient is being discharged due to meeting the stated rehab goals.  Thank you for the referral. Earlie Counts, PT 01/02/18 10:07 AM  ?????

## 2018-01-09 ENCOUNTER — Ambulatory Visit: Payer: Self-pay | Admitting: *Deleted

## 2018-01-09 ENCOUNTER — Encounter: Payer: Self-pay | Admitting: Family Medicine

## 2018-01-09 ENCOUNTER — Ambulatory Visit (INDEPENDENT_AMBULATORY_CARE_PROVIDER_SITE_OTHER): Payer: BLUE CROSS/BLUE SHIELD | Admitting: Family Medicine

## 2018-01-09 VITALS — BP 138/82 | HR 84 | Temp 98.5°F | Resp 18 | Ht 64.0 in | Wt 170.0 lb

## 2018-01-09 DIAGNOSIS — K219 Gastro-esophageal reflux disease without esophagitis: Secondary | ICD-10-CM | POA: Diagnosis not present

## 2018-01-09 DIAGNOSIS — R079 Chest pain, unspecified: Secondary | ICD-10-CM

## 2018-01-09 DIAGNOSIS — K224 Dyskinesia of esophagus: Secondary | ICD-10-CM

## 2018-01-09 MED ORDER — PANTOPRAZOLE SODIUM 40 MG PO TBEC: 40.0000 mg | DELAYED_RELEASE_TABLET | Freq: Every day | ORAL | 0 refills | Status: DC

## 2018-01-09 NOTE — Progress Notes (Signed)
    Subjective:  Amy Burton is a 57 y.o. female who presents today for same-day appointment with a chief complaint of chest pain.   HPI:  Chest Pain, acute problem Started earlier today. Pt reports that she was eating a piece of ham several hours ago when she felt like it got stuck in her esophagus. Feels like it got stuck for about 20 seconds. Since then, she has had a pressure-like sensation in her chest. The sensation has moved up and down her chest, but has been constant the past few hours.  Patient notes that she has been under a lot of stress recently due to her father recently passing away and buying a new home.  Patient was able to eat a bagel with water earlier today without any difficulty.  No treatments tried.  She has had reflux symptoms that have worsened over the last several days to weeks.  ROS: Per HPI  PMH: She reports that she has never smoked. She has never used smokeless tobacco. She reports that she drinks alcohol. She reports that she does not use drugs.  Objective:  Physical Exam: BP 138/82   Pulse 84   Temp 98.5 F (36.9 C)   Resp 18   Ht 5\' 4"  (1.626 m)   Wt 170 lb (77.1 kg)   LMP 10/26/2015 (Within Months)   SpO2 98%   BMI 29.18 kg/m   Gen: NAD, resting comfortably CV: RRR with no murmurs appreciated Pulm: NWOB, CTAB with no crackles, wheezes, or rhonchi Chest: No deformities.  Nontender to palpation.  EKG: Normal sinus rhythm.  No ischemic changes.  Assessment/Plan:  Chest Pain Likely secondary to esophageal spasm. Her EKG today is within normal limits and her history is not consistent with a cardiac or pulmonary etiology.  Do not suspect food lodged in her throat as she was able to eat a bagel after her symptoms started .  Discussed treatment options with patient.  Recommended starting Protonix for the next 1 to 2 weeks to treat underlying GERD.  Offered GI cocktail today, however patient deferred.  Protonix was sent into her pharmacy.   Discussed reasons to return to care.  Follow-up as needed.  Algis Greenhouse. Jerline Pain, MD 01/09/2018 3:54 PM

## 2018-01-09 NOTE — Telephone Encounter (Signed)
Patient was eating a piece of ham today when it got stuck in her throat it passed down after about 1 minute. Episode occurred about 5 hours ago. She is speaking in complete sentances and is in no acute distress. She reports a  Sensation of pressure in her  Esophageal area which she reports she has never had before.She reports recent stressors but is  In good health.No availability at [facility] today .Apointment made with Dr Jerline Pain today at [facility]  Reason for Disposition . [1] MILD pain (e.g., does not interfere with normal activities) AND [2] comes and goes (cramps) AND [3] present > 72 hours  Answer Assessment - Initial Assessment Questions 1. LOCATION: "Where does it hurt?"      Sensation  Upper  Epigastric 4  Inches   Below  [place]   2. RADIATION: "Does the pain shoot anywhere else?" (e.g., chest, back)      No 3. ONSET: "When did the pain begin?" (e.g., minutes, hours or days ago)       5  Hours   4. SUDDEN: "Gradual or sudden onset?"       Sensation  Of fullness is  Still there   5. PATTERN "Does the pain come and go, or is it constant?"    - If constant: "Is it getting better, staying the same, or worsening?"      (Note: Constant means the pain never goes away completely; most serious pain is constant and it progresses)     - If intermittent: "How long does it last?" "Do you have pain now?"     (Note: Intermittent means the pain goes away completely between bouts)      Constant  6. SEVERITY: "How bad is the pain?"  (e.g., Scale 1-10; mild, moderate, or severe)    - MILD (1-3): doesn't interfere with normal activities, abdomen soft and not tender to touch     - MODERATE (4-7): interferes with normal activities or awakens from sleep, tender to touch     - SEVERE (8-10): excruciating pain, doubled over, unable to do any normal activities           Mild   7. RECURRENT SYMPTOM: "Have you ever had this type of abdominal pain before?" If so, ask: "When was the last time?" and "What happened that  time?"        Yes   But  Never pressure   8. AGGRAVATING FACTORS: "Does anything seem to cause this pain?" (e.g., foods, stress, alcohol)      Pt got  Choked  On piece  Which passed briefly  Passed   9. CARDIAC SYMPTOMS: "Do you have any of the following symptoms: chest pain, difficulty breathing, sweating, nausea?"       Nausea   10. OTHER SYMPTOMS: "Do you have any other symptoms?" (e.g., fever, vomiting, diarrhea)       Sinus  Drainage  Pressure in upper  Epigastric area  Able  To  Drink  Water   11. PREGNANCY: "Is there any chance you are pregnant?" "When was your last menstrual period?"         Menapause  2  Years  Protocols used: ABDOMINAL PAIN - UPPER-A-AH

## 2018-01-09 NOTE — Patient Instructions (Signed)
You are likely having an esophageal spasm.  This is worsened by reflux.  I would like for you to start an acid blocker medication.  Please let me or your primary doctor know if your symptoms worsen or do not improve over the next several days.   Take care, Dr Jerline Pain

## 2018-01-13 ENCOUNTER — Other Ambulatory Visit: Payer: Self-pay

## 2018-01-13 ENCOUNTER — Ambulatory Visit (INDEPENDENT_AMBULATORY_CARE_PROVIDER_SITE_OTHER): Payer: BLUE CROSS/BLUE SHIELD | Admitting: Obstetrics and Gynecology

## 2018-01-13 ENCOUNTER — Encounter: Payer: Self-pay | Admitting: Obstetrics and Gynecology

## 2018-01-13 ENCOUNTER — Other Ambulatory Visit (HOSPITAL_COMMUNITY)
Admission: RE | Admit: 2018-01-13 | Discharge: 2018-01-13 | Disposition: A | Discharge status: Home / Self Care | Payer: BLUE CROSS/BLUE SHIELD | Source: Ambulatory Visit | Attending: Obstetrics and Gynecology | Admitting: Obstetrics and Gynecology

## 2018-01-13 VITALS — BP 110/60 | HR 68 | Resp 16 | Ht 65.0 in | Wt 170.0 lb

## 2018-01-13 DIAGNOSIS — F419 Anxiety disorder, unspecified: Secondary | ICD-10-CM | POA: Diagnosis not present

## 2018-01-13 DIAGNOSIS — F329 Major depressive disorder, single episode, unspecified: Secondary | ICD-10-CM | POA: Diagnosis not present

## 2018-01-13 DIAGNOSIS — Z79899 Other long term (current) drug therapy: Secondary | ICD-10-CM | POA: Diagnosis not present

## 2018-01-13 DIAGNOSIS — Z01419 Encounter for gynecological examination (general) (routine) without abnormal findings: Secondary | ICD-10-CM | POA: Diagnosis present

## 2018-01-13 NOTE — Patient Instructions (Signed)

## 2018-01-13 NOTE — Progress Notes (Addendum)
57 y.o. [id number] [name] here for annual exam.    Denies vaginal bleeding or spotting.   Has a pessary for a second degree rectocele.  Trying not to do a lot of heavy lifting.  She is in the process of moving.   States she does have a history of fibroids diagnosed with Dr. Ronita Hipps.   Labs with PCP.   PCP: Dr. Billey Gosling    Patient's last menstrual period was 10/26/2015 (within months).           Sexually active: Yes.    The current method of family planning is tubal ligation and ovaries removed/postmenopausal.    Exercising: Yes.    walking and pelvic floor exercises Smoker:  no  Health Maintenance: Pap:   01/24/17 Negative per patient History of abnormal Pap:  no MMG:   Per patient does thermography every year.  Does not do mammograms.  Notes the temperature is elevated in the right breast.  Last mammogram was 5 years ago.  Colonoscopy:  About summer 2017 -- small polyp versus "freckle" noted.   BMD:   n/a  Result  n/a TDaP:  Patient declines.  Not had in 10 years.  Gardasil:   n/a HIV: negative in the past per patient Hep C: negative in the past per patient Screening Labs:  PCP   reports that she has never smoked. She has never used smokeless tobacco. She reports that she drinks alcohol. She reports that she does not use drugs.  Past Medical History:  Diagnosis Date  . Anxiety   . Colon polyp   . Depression   . Endometriosis   . Fibroid   . Thyroid disease     Past Surgical History:  Procedure Laterality Date  . APPENDECTOMY    . BREAST SURGERY    . CESAREAN SECTION    . endomitriosis  1995  . OOPHORECTOMY Right 1996  . SALPINGECTOMY Bilateral 1996    Current Outpatient Medications  Medication Sig Dispense Refill  . ALPRAZolam (XANAX) 0.25 MG tablet Take 1 tablet (0.25 mg total) by mouth daily as needed for anxiety (For Dental work and traveling). 30 tablet 0  . Ascorbic Acid (VITAMIN C) 1000 MG tablet Take 1,000 mg by mouth daily.    . B  Complex Vitamins (VITAMIN B COMPLEX PO) Take by mouth.    . cetirizine (ZYRTEC) 10 MG tablet Take 1 tablet (10 mg total) by mouth daily. 30 tablet 11  . Cyanocobalamin (VITAMIN B-12 PO) Take 5,000 mg by mouth daily.     Marland Kitchen ibuprofen (ADVIL,MOTRIN) 600 MG tablet Take 600 mg by mouth every 6 (six) hours as needed.    . pantoprazole (PROTONIX) 40 MG tablet Take 1 tablet (40 mg total) by mouth daily. (Patient not taking: Reported on 01/13/2018) 30 tablet 0   No current facility-administered medications for this visit.     Family History  Problem Relation Age of Onset  . Hyperlipidemia Mother   . Hypertension Mother   . Diabetes Father   . Prostate cancer Father   . Arthritis Sister   . Rheum arthritis Sister   . Heart disease Brother   . Heart attack Brother   . Cancer Maternal Grandmother        breast    Review of Systems  Constitutional: Negative.   HENT: Negative.   Eyes: Negative.   Respiratory: Negative.   Cardiovascular: Negative.   Gastrointestinal: Negative.   Endocrine: Negative.   Genitourinary: Negative.   Musculoskeletal:  Negative.   Skin: Negative.   Allergic/Immunologic: Negative.   Neurological: Negative.   Hematological: Negative.   Psychiatric/Behavioral: Negative.     Exam:   BP 110/60 (BP Location: Right Arm, Patient Position: Sitting, Cuff Size: Normal)   Pulse 68   Resp 16   Ht 5\' 5"  (1.651 m)   Wt 170 lb (77.1 kg)   LMP 10/26/2015 (Within Months)   BMI 28.29 kg/m     General appearance: alert, cooperative and appears stated age Head: Normocephalic, without obvious abnormality, atraumatic Neck: no adenopathy, supple, symmetrical, trachea midline and thyroid normal to inspection and palpation Lungs: clear to auscultation bilaterally Breasts: right - normal appearance, no masses or tenderness, No nipple retraction or dimpling, No nipple discharge or bleeding, No axillary or supraclavicular adenopathy. Left breast with retraction lateral to the  nipple (old change and scar here).  No dominant masses, tenderness or axillary/supraclavicular adenopathy.  Heart: regular rate and rhythm Abdomen: soft, non-tender; no masses, no organomegaly Extremities: extremities normal, atraumatic, no cyanosis or edema Skin: Skin color, texture, turgor normal. No rashes or lesions Lymph nodes: Cervical, supraclavicular, and axillary nodes normal. No abnormal inguinal nodes palpated Neurologic: Grossly normal  Pelvic: External genitalia:  no lesions              Urethra:  normal appearing urethra with no masses, tenderness or lesions              Bartholins and Skenes: normal                 Vagina: normal appearing vagina with normal color and discharge, no lesions              Cervix: no lesions.  ADDENDUM - cervical stenosis noted.               Pap taken: Yes.   Bimanual Exam:  Uterus:   Two to three 1 cm anterior fundal fibroid noted on exam. Nontender.               Adnexa: no mass, fullness, tenderness              Rectal exam: Yes.  .  Confirms.  Second degree rectocele noted with rectal exam today.               Anus:  normal sphincter tone, no lesions  Chaperone was present for exam.  Assessment:   Well woman visit with normal exam. Status post right oophorectomy and bilateral salpingectomy.  Rectocele.  Not doing mammograms.  Fibroids.  ADDENDUM - Cervical stenosis.   Plan: Mammogram screening with mammogram discussed.  I reviewed Up to Date information about thermography and the need to do this in conjunction with mammogram in order to have effective breast cancer screening.  Recommended self breast awareness. Pap and HR HPV as above. Guidelines for Calcium, Vitamin D, regular exercise program including cardiovascular and weight bearing exercise. Labs with PCP.  Will get records from Dr. Kennith Maes office for the last 3 years of care.  I recommend tetanus vaccination with any significant injury/break to the skin.  Follow up  annually and prn.   After visit summary provided.

## 2018-01-21 LAB — CYTOLOGY - PAP
Adequacy: ABSENT
Diagnosis: NEGATIVE
HPV: NOT DETECTED

## 2018-01-27 ENCOUNTER — Ambulatory Visit: Payer: PRIVATE HEALTH INSURANCE | Admitting: Obstetrics and Gynecology

## 2018-06-13 ENCOUNTER — Telehealth: Payer: Self-pay | Admitting: Obstetrics and Gynecology

## 2018-06-13 NOTE — Telephone Encounter (Signed)
Patient is having a "little issue with her rectocele and a small bump" that she found. She is questioning if this small bump is normal for her rectocele?

## 2018-06-13 NOTE — Telephone Encounter (Signed)
Spoke with patient. Hx Rectocele and history of both internal and external hemorrhoids. . She states she has been experiencing a few weeks of constipation and difficulty with bowel movements, small amount of bright red rectal bleeding with bowel movements in the last week. She is sure it is is not coming from the vagina. Yesterday , states she was using lubrication rectally and "felt a small bump" internally. She has not felt this ever before.  She feels that her rectocele is worsening and would like to be evaluated by Dr. Quincy Simmonds. No current active vaginal bleeding. No vomiting or abdominal pain.  She lives out of the area now so has to coordinate travel plans.  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 She was offered an appointment for Monday but declined due to travel. Pt aware work in appointment for evaluation.  Pt aware if she is unable to have a bowel movement or void, to go to closest emergency department.  She is happy with plan of care. Feels she cannot be seen locally where she lives.   Routing to Dr. Quincy Simmonds and will close encounter.

## 2018-06-18 ENCOUNTER — Other Ambulatory Visit: Payer: Self-pay

## 2018-06-18 ENCOUNTER — Encounter: Payer: Self-pay | Admitting: Obstetrics and Gynecology

## 2018-06-18 ENCOUNTER — Ambulatory Visit (INDEPENDENT_AMBULATORY_CARE_PROVIDER_SITE_OTHER): Payer: BLUE CROSS/BLUE SHIELD | Admitting: Obstetrics and Gynecology

## 2018-06-18 VITALS — BP 118/64 | HR 70 | Ht 65.0 in | Wt 168.6 lb

## 2018-06-18 DIAGNOSIS — N816 Rectocele: Secondary | ICD-10-CM | POA: Diagnosis not present

## 2018-06-18 DIAGNOSIS — K649 Unspecified hemorrhoids: Secondary | ICD-10-CM | POA: Diagnosis not present

## 2018-06-18 MED ORDER — LIDOCAINE-HYDROCORTISONE ACE 3-1 % RE KIT: 1.0000 "application " | PACK | Freq: Two times a day (BID) | RECTAL | 0 refills | Status: DC

## 2018-06-18 NOTE — Progress Notes (Signed)
GYNECOLOGY  VISIT   HPI: 57 y.o.   Married  Caucasian  female   [phone] with Patient's last menstrual period was 10/26/2015 (within months).   here for evaluation of rectal bleeding and patient states she felt a "lump" inside rectal area. She does have a history of rectocele. Had bleeding with a BM recently.  Not in the stool itself. States this occurs once a year.   Has a known second degree rectocele.  Has a pessary but does not use it.   Stopped her pelvic floor therapy.   Some constipation and is taking Psyllium. Has hemorrhoids and lump on the hemorrhoid.   Does use probiotics and enzymes.   Lifted during her move.   Urinary urgency, frequency, and night time urination. Depends on what she drinks.  No leak with cough, laugh or sneeze.   Hx of fibroids.   Living in [place] area.   GYNECOLOGIC HISTORY: Patient's last menstrual period was 10/26/2015 (within months). Contraception: Tubal/ovaries removed/postmenopausal Menopausal hormone therapy:  none Last mammogram: Per patient does thermography every year.  Does not do mammograms.  Notes the temperature is elevated in the right breast.  Last mammogram was 5 years ago.  Last pap smear: 01-13-18 Neg:Neg HR HPV, 01-24-17 Neg per patient        OB History    Gravida  5   Para  3   Term  0   Preterm  0   AB  1   Living  3     SAB  1   TAB  0   Ectopic  0   Multiple  0   Live Births  0              Patient Active Problem List   Diagnosis Date Noted  . Dysuria 10/22/2017  . Nonallopathic lesion of thoracic region 05/15/2017  . Nonallopathic lesion of cervical region 05/15/2017  . Nonallopathic lesion of sacral region 05/15/2017  . Chronic neck pain 04/30/2017  . Chronic back pain 04/30/2017  . Arthralgia of hip 04/30/2017  . Pelvic prolapse 04/30/2017  . 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 Eczema 09/07/2016  . Urinary urgency 09/07/2016  . Chronic sinusitis 09/29/2015  . Allergic rhinitis  09/29/2015  . Prediabetes 09/29/2015  . Vitamin D deficiency 09/29/2015  . Situational anxiety-flying 09/29/2015  . ETD (Eustachian tube dysfunction), bilateral 09/29/2015    Past Medical History:  Diagnosis Date  . Anxiety   . Colon polyp   . Depression   . Endometriosis   . Fibroid   . Thyroid disease     Past Surgical History:  Procedure Laterality Date  . APPENDECTOMY    . BREAST SURGERY    . CESAREAN SECTION    . endomitriosis  1995  . OOPHORECTOMY Right 1996  . SALPINGECTOMY Bilateral 1996    Current Outpatient Medications  Medication Sig Dispense Refill  . Ascorbic Acid (VITAMIN C) 1000 MG tablet Take 1,000 mg by mouth daily.    . B Complex Vitamins (VITAMIN B COMPLEX PO) Take by mouth.    . cetirizine (ZYRTEC) 10 MG tablet Take 1 tablet (10 mg total) by mouth daily. 30 tablet 11  . Cyanocobalamin (VITAMIN B-12 PO) Take 5,000 mg by mouth daily.     Marland Kitchen ibuprofen (ADVIL,MOTRIN) 600 MG tablet Take 600 mg by mouth every 6 (six) hours as needed.    . psyllium (METAMUCIL) 58.6 % packet Take 1 packet by mouth daily.     No current facility-administered  medications for this visit.      ALLERGIES: Sulfa antibiotics  Family History  Problem Relation Age of Onset  . Hyperlipidemia Mother   . Hypertension Mother   . Diabetes Father   . Prostate cancer Father   . Arthritis Sister   . Rheum arthritis Sister   . Heart disease Brother   . Heart attack Brother   . Cancer Maternal Grandmother        breast    Social History   Socioeconomic History  . Marital status: Married    Spouse name: Not on file  . Number of children: Not on file  . Years of education: Not on file  . Highest education level: Not on file  Occupational History  . Not on file  Social Needs  . Financial resource strain: Not on file  . Food insecurity:    Worry: Not on file    Inability: Not on file  . Transportation needs:    Medical: Not on file    Non-medical: Not on file  Tobacco Use   . Smoking status: Never Smoker  . Smokeless tobacco: Never Used  Substance and Sexual Activity  . Alcohol use: Yes    Comment: occ  . Drug use: No  . Sexual activity: Yes    Birth control/protection: Surgical    Comment: Salpingectomy/Oophorectomy   Lifestyle  . Physical activity:    Days per week: Not on file    Minutes per session: Not on file  . Stress: Not on file  Relationships  . Social connections:    Talks on phone: Not on file    Gets together: Not on file    Attends religious service: Not on file    Active member of club or organization: Not on file    Attends meetings of clubs or organizations: Not on file    Relationship status: Not on file  . Intimate partner violence:    Fear of current or ex partner: Not on file    Emotionally abused: Not on file    Physically abused: Not on file    Forced sexual activity: Not on file  Other Topics Concern  . Not on file  Social History Narrative  . Not on file    Review of Systems  Gastrointestinal:       Rectal bleeding Lump or mass in rectal area  All other systems reviewed and are negative.   PHYSICAL EXAMINATION:    BP 118/64 (BP Location: Right Arm, Patient Position: Sitting, Cuff Size: Normal)   Pulse 70   Ht 5\' 5"  (1.651 m)   Wt 168 lb 9.6 oz (76.5 kg)   LMP 10/26/2015 (Within Months)   BMI 28.06 kg/m     General appearance: alert, cooperative and appears stated age    Pelvic: External genitalia:  no lesions              Urethra:  normal appearing urethra with no masses, tenderness or lesions              Bartholins and Skenes: normal                 Vagina: normal appearing vagina with normal color and discharge, no lesions.  Second degree rectocele.  Minimal cystocele.               Cervix: no lesions                Bimanual Exam:  Uterus:  normal  size, contour, position, consistency, mobility, non-tender              Adnexa: no mass, fullness, tenderness              Rectal exam: Yes.  .   Confirms.              Anus:  normal sphincter tone, no lesions.  Multiple hemorrhoids with some erythema.  No thrombosis.   Chaperone was present for exam.  ASSESSMENT   Rectocele.  Hemorrhoids.   PLAN  We discussed her rectocele - risk factors, treatment options.  She will restart PT in [place].  I discussed rectocele repair with patient.  We talked about ways to reduce constipation.  Anamantle for hemorrhoid pain.  If she has recurrent rectal bleeding, she knows she needs to see GI.    An After Visit Summary was printed and given to the patient.  __25____ minutes face to face time of which over 50% was spent in counseling.

## 2018-08-16 ENCOUNTER — Encounter

## 2018-09-29 DIAGNOSIS — R5383 Other fatigue: Secondary | ICD-10-CM | POA: Diagnosis not present

## 2018-09-29 DIAGNOSIS — R946 Abnormal results of thyroid function studies: Secondary | ICD-10-CM | POA: Diagnosis not present

## 2018-09-29 DIAGNOSIS — Z13220 Encounter for screening for lipoid disorders: Secondary | ICD-10-CM | POA: Diagnosis not present

## 2018-09-29 DIAGNOSIS — E559 Vitamin D deficiency, unspecified: Secondary | ICD-10-CM | POA: Diagnosis not present

## 2018-10-23 ENCOUNTER — Ambulatory Visit: Payer: 59 | Admitting: Family Medicine

## 2018-10-30 NOTE — Progress Notes (Signed)
GYNECOLOGY  VISIT   HPI: 58 y.o.   Married  Caucasian  female   [phone] with Patient's last menstrual period was 10/26/2015 (within months).   here for evaluation of rectocele.   Considering surgery.  Known second degree rectocele and hemorrhoids. Did PT but not continuing with this.  Feels like the rectocele is progressed.   Having BMs OK but is splinting and feel more tissue. No fecal incontinence.  Not having pain.  Intercourse is not hurting.   Voids often but drinking a lot of water.  Some increased urgency.  Up once per night.  Spontaneous leakage twice in 6 months.  No leak with cough, laugh, sneeze.   [place]   GYNECOLOGIC HISTORY: Patient's last menstrual period was 10/26/2015 (within months). Contraception: Tubal/ovaries removed/Postmenopausal Menopausal hormone therapy:  none Last mammogram: Per patient does thermography every year. Does not do mammograms. Notes the temperature is elevated in the right breast. Last mammogram was 5 years ago.  Last pap smear: 01-13-18 Neg:Neg HR HPV, 01-24-17 Neg per patient         OB History    Gravida  5   Para  3   Term  0   Preterm  0   AB  1   Living  3     SAB  1   TAB  0   Ectopic  0   Multiple  0   Live Births  0              Patient Active Problem List   Diagnosis Date Noted  . Dysuria 10/22/2017  . Nonallopathic lesion of thoracic region 05/15/2017  . Nonallopathic lesion of cervical region 05/15/2017  . Nonallopathic lesion of sacral region 05/15/2017  . Chronic neck pain 04/30/2017  . Chronic back pain 04/30/2017  . Arthralgia of hip 04/30/2017  . Pelvic prolapse 04/30/2017  . 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 Eczema 09/07/2016  . Urinary urgency 09/07/2016  . Chronic sinusitis 09/29/2015  . Allergic rhinitis 09/29/2015  . Prediabetes 09/29/2015  . Vitamin D deficiency 09/29/2015  . Situational anxiety-flying 09/29/2015  . ETD (Eustachian tube  dysfunction), bilateral 09/29/2015    Past Medical History:  Diagnosis Date  . Anxiety   . Colon polyp   . Depression   . Endometriosis   . Fibroid   . Thyroid disease     Past Surgical History:  Procedure Laterality Date  . APPENDECTOMY    . BREAST SURGERY    . CESAREAN SECTION    . endomitriosis  1995  . OOPHORECTOMY Right 1996  . SALPINGECTOMY Bilateral 1996    Current Outpatient Medications  Medication Sig Dispense Refill  . Ascorbic Acid (VITAMIN C) 1000 MG tablet Take 1,000 mg by mouth daily.    . B Complex Vitamins (VITAMIN B COMPLEX PO) Take by mouth.    . cetirizine (ZYRTEC) 10 MG tablet Take 1 tablet (10 mg total) by mouth daily. 30 tablet 11  . Cyanocobalamin (VITAMIN B-12 PO) Take 5,000 mg by mouth daily.     Marland Kitchen ibuprofen (ADVIL,MOTRIN) 600 MG tablet Take 600 mg by mouth every 6 (six) hours as needed.    . lidocaine-hydrocortisone (ANAMANTLE) 3-1 % KIT Place 1 application rectally 2 (two) times daily. 1 each 0  . psyllium (METAMUCIL) 58.6 % packet Take 1 packet by mouth daily.    Marland Kitchen ALPRAZolam (XANAX) 0.25 MG tablet Take 1 tablet by mouth as needed. Takes when she flies or goes to  dentist     No current facility-administered medications for this visit.      ALLERGIES: Sulfa antibiotics  Family History  Problem Relation Age of Onset  . Hyperlipidemia Mother   . Hypertension Mother   . Diabetes Father   . Prostate cancer Father   . Arthritis Sister   . Rheum arthritis Sister   . Heart disease Brother   . Heart attack Brother   . Cancer Maternal Grandmother        breast    Social History   Socioeconomic History  . Marital status: Married    Spouse name: Not on file  . Number of children: Not on file  . Years of education: Not on file  . Highest education level: Not on file  Occupational History  . Not on file  Social Needs  . Financial resource strain: Not on file  . Food insecurity:    Worry: Not on file    Inability: Not on file  .  Transportation needs:    Medical: Not on file    Non-medical: Not on file  Tobacco Use  . Smoking status: Never Smoker  . Smokeless tobacco: Never Used  Substance and Sexual Activity  . Alcohol use: Yes    Comment: occ  . Drug use: No  . Sexual activity: Yes    Birth control/protection: Surgical    Comment: Salpingectomy/Oophorectomy   Lifestyle  . Physical activity:    Days per week: Not on file    Minutes per session: Not on file  . Stress: Not on file  Relationships  . Social connections:    Talks on phone: Not on file    Gets together: Not on file    Attends religious service: Not on file    Active member of club or organization: Not on file    Attends meetings of clubs or organizations: Not on file    Relationship status: Not on file  . Intimate partner violence:    Fear of current or ex partner: Not on file    Emotionally abused: Not on file    Physically abused: Not on file    Forced sexual activity: Not on file  Other Topics Concern  . Not on file  Social History Narrative  . Not on file    Review of Systems  All other systems reviewed and are negative.   PHYSICAL EXAMINATION:    BP 100/64 (BP Location: Right Arm, Patient Position: Sitting, Cuff Size: Normal)   Pulse 70   Ht _0  (1.651 m)   Wt 170 lb (77.1 kg)   LMP 10/26/2015 (Within Months)   BMI 28.29 kg/m     General appearance: alert, cooperative and appears stated age   Pelvic: External genitalia:  no lesions              Urethra:  normal appearing urethra with no masses, tenderness or lesions              Bartholins and Skenes: normal                 Vagina: normal appearing vagina with normal color and discharge, no lesions.  Just at a third degree prolapse of rectocele.  First degree cystocele.  Good uterine support.               Cervix: no lesions                Bimanual Exam:  Uterus:  normal size,  contour, position, consistency, mobility, non-tender              Adnexa: no mass,  fullness, tenderness              Rectal exam: Yes.  .  Confirms.              Anus:  normal sphincter tone, no lesions  Chaperone was present for exam.  ASSESSMENT  Third degree rectocele.  Minor cystocele.  Not really symptomatic.   PLAN  We discussed her pelvic organ prolapse and focused on her rectocele.  We discussed the levels of pelvic organ prolapse and risk factors.  We reviewed surgical rectocele repair and some of the potential risks including but not limited to bleeding, infection, damage to surrounding organs, reaction to anesthesia, pneumonia, need for reoperation, and risk of recurrence.  She may research options for surgical care in the [place] area.  Reminder for annual exam in May, 2020, either here or there.    An After Visit Summary was printed and given to the patient.  _25_____ minutes face to face time of which over 50% was spent in counseling.

## 2018-10-31 ENCOUNTER — Ambulatory Visit (INDEPENDENT_AMBULATORY_CARE_PROVIDER_SITE_OTHER): Payer: BLUE CROSS/BLUE SHIELD | Admitting: Obstetrics and Gynecology

## 2018-10-31 ENCOUNTER — Encounter: Payer: Self-pay | Admitting: Family Medicine

## 2018-10-31 ENCOUNTER — Ambulatory Visit (INDEPENDENT_AMBULATORY_CARE_PROVIDER_SITE_OTHER): Payer: BLUE CROSS/BLUE SHIELD | Admitting: Family Medicine

## 2018-10-31 ENCOUNTER — Other Ambulatory Visit: Payer: Self-pay

## 2018-10-31 ENCOUNTER — Encounter: Payer: Self-pay | Admitting: Obstetrics and Gynecology

## 2018-10-31 VITALS — BP 100/64 | HR 70 | Ht 65.0 in | Wt 170.0 lb

## 2018-10-31 VITALS — BP 130/70 | HR 68 | Ht 65.0 in | Wt 169.0 lb

## 2018-10-31 DIAGNOSIS — M999 Biomechanical lesion, unspecified: Secondary | ICD-10-CM

## 2018-10-31 DIAGNOSIS — M549 Dorsalgia, unspecified: Secondary | ICD-10-CM | POA: Diagnosis not present

## 2018-10-31 DIAGNOSIS — G8929 Other chronic pain: Secondary | ICD-10-CM

## 2018-10-31 DIAGNOSIS — N816 Rectocele: Secondary | ICD-10-CM

## 2018-10-31 NOTE — Assessment & Plan Note (Signed)
Seem to be more secondary to the sacroiliac joint today.  Responded very well to osteopathic manipulation.  Discussed core strengthening and given exercises working on hip abductor strengthening.  Discussed with patient about posture and ergonomics.  Patient is traveling a little more and driving little more and we discussed positioning and this could be beneficial as well.  Discussed vitamin supplementations.  Follow-up again 4 to 8 weeks

## 2018-10-31 NOTE — Patient Instructions (Signed)
Anterior and Posterior Colporrhaphy  Anterior or posterior colporrhaphy is surgery to fix a prolapse of organs in the genital tract. Prolapse is a condition in which an organ bulges or drops down from its normal position. Organs that commonly prolapse include the rectum, bladder, vagina, and uterus. Prolapse can affect a single organ or several organs at the same time.  You may need this surgery if you have a severe prolapse that causes symptoms that interfere with your daily life and cannot be corrected with other treatments. Prolapse often worsens when women stop having their monthly periods (menopause) because estrogen loss weakens the muscles and tissues in the genital tract. Prolapse can also happen when the organs are damaged or weakened. This commonly happens after childbirth and as a result of aging. The type of colporrhaphy done depends on the type of genital prolapse. Types of genital prolapse include the following:  Cystocele. This is a prolapse of the bladder and the upper part of the front (anterior) wall of the vagina.  Rectocele. This is a prolapse of the rectum and the lower part of the back (posterior) wall of the vagina.  Enterocele. This is a prolapse of the small intestine. It appears as a bulge under the neck of the uterus at the top of the back wall of the vagina.  Procidentia. This is a complete prolapse of the uterus and the cervix. The prolapse can be seen and felt coming out of the vagina. Tell a health care provider about:  Any allergies you have.  All medicines you are taking, including vitamins, herbs, eye drops, creams, and over-the-counter medicines.  Any problems you or family members have had with anesthetic medicines.  Any blood disorders you have.  Any surgeries you have had.  Any medical conditions you have.  Smoking history or history of alcohol use.  Whether you are pregnant or may be pregnant. What are the risks? Generally, this is a safe  procedure. However, problems may occur, including:  Infection.  Bleeding.  Allergic reactions to medicines.  Damage to other structures or organs.  Problems urinating.  Incontinence.  Nerve damage.  Painful sex.  Constipation.  A blood clot that travels to your lungs. What happens before the procedure? Staying hydrated Follow instructions from your health care provider about hydration, which may include:  Up to 2 hours before the procedure - you may continue to drink clear liquids, such as water, clear fruit juice, black coffee, and plain tea. Eating and drinking restrictions Follow instructions from your health care provider about eating and drinking, which may include:  8 hours before the procedure - stop eating heavy meals or foods such as meat, fried foods, or fatty foods.  6 hours before the procedure - stop eating light meals or foods, such as toast or cereal.  6 hours before the procedure - stop drinking milk or drinks that contain milk.  2 hours before the procedure - stop drinking clear liquids. General instructions  Ask your health care provider about: ? Changing or stopping your regular medicines. This is especially important if you are taking diabetes medicines or blood thinners. ? Taking medicines such as aspirin and ibuprofen. These medicines can thin your blood. Do not take these medicines before your procedure if your health care provider instructs you not to.  You may be given antibiotics to help prevent infection.  You may be instructed to use estrogen cream in your vagina to help prevent complications and promote healing.  Do not use  any products that contain nicotine or tobacco, such as cigarettes and e-cigarettes, for at least 2 weeks before the procedure. If you need help quitting, ask your health care provider.  Plan to have someone take you home from the hospital. Also, arrange for someone to help you with activities during recovery. What  happens during the procedure?  To lower your risk of infection: ? Your health care team will wash or sanitize their hands. ? Your skin will be washed with soap. ? Hair may be removed from the surgical area.  An IV will be inserted into one of your veins.  You will be given one or more of the following: ? A medicine to help you relax (sedative). ? A medicine to make you fall asleep (general anesthetic).  You may be given antibiotics through your IV.  You will lie down on the operating table with your feet in stirrups.  A small, thin tube (catheter) will be inserted through your urethra into your bladder to drain urine during surgery and recovery.  An instrument (vaginal speculum) will be used to hold your vagina open.  Your health care provider will perform the procedure according to the type of repair you require: Anterior repair  An incision will be made in the midline section of the front part of the vaginal wall.  A triangular-shaped piece of vaginal tissue will be removed.  The stronger, healthier tissue will be sewn together in order to support the bladder.  These incisions may be closed with stitches (sutures).  Gauze packing will be placed inside your vagina. Posterior repair  An incision will be made midline on the back wall of the vagina.  A triangular portion of vaginal skin will be removed to expose the muscle.  Excess tissue will be removed, and stronger, healthier muscle and ligament tissue will be sewn together to support the rectum.  These incisions may be closed with stitches (sutures).  Gauze packing will be placed inside your vagina. Anterior and posterior repair  Both procedures will be done during the same surgery. The procedure may vary among health care providers and hospitals. What happens after the procedure?  Your blood pressure, heart rate, breathing rate, and blood oxygen level will be monitored until the medicines you were given have worn  off.  You will be given pain medicine as needed.  You will have a small tube in place to drain your bladder (urinary catheter). This will be in place until your bladder is working properly on its own.  You may have a gauze packing in your vagina for a few days to prevent bleeding.  You will start on a liquid diet and slowly move to a regular diet.  You will be encouraged to get up and walk as soon as you are able.  You may need to wear compression stockings. They help prevent blood clots and reduce swelling in your legs.  Do not drive for 24 hours if you were given a sedative. Summary  Anterior or posterior colporrhaphy is surgery to fix a prolapse of organs in the genital tract.  The type of repair done depends on the type of prolapse that is present.  Follow instructions from your health care provider about eating and drinking before the procedure.  You will be given a general anesthetic to make you fall asleep during the procedure. This information is not intended to replace advice given to you by your health care provider. Make sure you discuss any questions you have  with your health care provider. Document Released: 11/03/2003 Document Revised: 09/20/2016 Document Reviewed: 09/20/2016 Elsevier Interactive Patient Education  2019 [employer].

## 2018-10-31 NOTE — Patient Instructions (Addendum)
Good to see you  Ice is your friend Exercises 3 times a week.  Exercises on wall.  Heel and butt touching.  Raise leg 6 inches and hold 2 seconds.  Down slow for count of 4 seconds.  1 set of 30 reps daily on both sides.  Work on the core strength  See me again in 4-8 weeks

## 2018-10-31 NOTE — Assessment & Plan Note (Signed)
Decision today to treat with OMT was based on Physical Exam  After verbal consent patient was treated with HVLA, ME, FPR techniques in cervical, thoracic, rib lumbar and sacral areas  Patient tolerated the procedure well with improvement in symptoms  Patient given exercises, stretches and lifestyle modifications  See medications in patient instructions if given  Patient will follow up in 4-8 weeks 

## 2018-10-31 NOTE — Progress Notes (Signed)
[facility] [place], [place] [address] Phone: [phone] Subjective:   Amy Burton, am serving as a scribe for Dr. Hulan Saas.   CC: Chronic back pain  VEL:[id number]  Amy Burton is a 58 y.o. female coming in with complaint of chronic back pain, patient is a avid talker for it is in precarious positions from time to time.  Patient has had shoulder bite as well as low back pain.  Has been responding well to osteopathic manipulation and home exercises.  Patient did have to move and has not been seen for over a year.  Patient states that she has been having left sided lower back pain since her move. Pain increased in November 2019. Does have radiating pain that seems to switch between right and left legs.      Past Medical History:  Diagnosis Date  . Anxiety   . Colon polyp   . Depression   . Endometriosis   . Fibroid   . Thyroid disease    Past Surgical History:  Procedure Laterality Date  . APPENDECTOMY    . BREAST SURGERY    . CESAREAN SECTION    . endomitriosis  1995  . OOPHORECTOMY Right 1996  . SALPINGECTOMY Bilateral 1996   Social History   Socioeconomic History  . Marital status: Married    Spouse name: Not on file  . Number of children: Not on file  . Years of education: Not on file  . Highest education level: Not on file  Occupational History  . Not on file  Social Needs  . Financial resource strain: Not on file  . Food insecurity:    Worry: Not on file    Inability: Not on file  . Transportation needs:    Medical: Not on file    Non-medical: Not on file  Tobacco Use  . Smoking status: Never Smoker  . Smokeless tobacco: Never Used  Substance and Sexual Activity  . Alcohol use: Yes    Comment: occ  . Drug use: Burton  . Sexual activity: Yes    Birth control/protection: Surgical    Comment: Salpingectomy/Oophorectomy   Lifestyle  . Physical activity:    Days per week: Not on file    Minutes per  session: Not on file  . Stress: Not on file  Relationships  . Social connections:    Talks on phone: Not on file    Gets together: Not on file    Attends religious service: Not on file    Active member of club or organization: Not on file    Attends meetings of clubs or organizations: Not on file    Relationship status: Not on file  Other Topics Concern  . Not on file  Social History Narrative  . Not on file   Allergies  Allergen Reactions  . Sulfa Antibiotics    Family History  Problem Relation Age of Onset  . Hyperlipidemia Mother   . Hypertension Mother   . Diabetes Father   . Prostate cancer Father   . Arthritis Sister   . Rheum arthritis Sister   . Heart disease Brother   . Heart attack Brother   . Cancer Maternal Grandmother        breast      Current Outpatient Medications (Respiratory):  .  cetirizine (ZYRTEC) 10 MG tablet, Take 1 tablet (10 mg total) by mouth daily.  Current Outpatient Medications (Analgesics):  .  ibuprofen (ADVIL,MOTRIN) 600  MG tablet, Take 600 mg by mouth every 6 (six) hours as needed.  Current Outpatient Medications (Hematological):  Marland Kitchen  Cyanocobalamin (VITAMIN B-12 PO), Take 5,000 mg by mouth daily.   Current Outpatient Medications (Other):  Marland Kitchen  Ascorbic Acid (VITAMIN C) 1000 MG tablet, Take 1,000 mg by mouth daily. .  B Complex Vitamins (VITAMIN B COMPLEX PO), Take by mouth. .  lidocaine-hydrocortisone (ANAMANTLE) 3-1 % KIT, Place 1 application rectally 2 (two) times daily. .  psyllium (METAMUCIL) 58.6 % packet, Take 1 packet by mouth daily.    Past medical history, social, surgical and family history all reviewed in electronic medical record.  Burton pertanent information unless stated regarding to the chief complaint.   Review of Systems:  Burton headache, visual changes, nausea, vomiting, diarrhea, constipation, dizziness, abdominal pain, skin rash, fevers, chills, night sweats, weight loss, swollen lymph nodes, body aches, joint  swelling,  chest pain, shortness of breath, mood changes.  Positive muscle aches  Objective  Blood pressure 130/70, pulse 68, height '5\' 5"'  (1.651 m), weight 169 lb (76.7 kg), last menstrual period 10/26/2015, SpO2 98 %.    General: Burton apparent distress alert and oriented x3 mood and affect normal, dressed appropriately.  HEENT: Pupils equal, extraocular movements intact  Respiratory: Patient's speak in full sentences and does not appear short of breath  Cardiovascular: Burton lower extremity edema, non tender, Burton erythema  Skin: Warm dry intact with Burton signs of infection or rash on extremities or on axial skeleton.  Abdomen: Soft nontender  Neuro: Cranial nerves II through XII are intact, neurovascularly intact in all extremities with 2+ DTRs and 2+ pulses.  Lymph: Burton lymphadenopathy of posterior or anterior cervical chain or axillae bilaterally.  Gait normal with good balance and coordination.  MSK:  Non tender with full range of motion and good stability and symmetric strength and tone of shoulders, elbows, wrist, hip, knee and ankles bilaterally.   Back Exam:  Inspection: Mild loss of lordosis Motion: Flexion 45 deg, Extension 25 deg, Side Bending to 35 deg bilaterally, Rotation to 35 deg bilaterally  SLR laying: Negative  XSLR laying: Negative  Palpable tenderness: Tender to palpation of the paraspinal musculature of the lumbar spine right greater than left. FABER: negative. Sensory change: Gross sensation intact to all lumbar and sacral dermatomes.  Reflexes: 2+ at both patellar tendons, 2+ at achilles tendons, Babinski's downgoing.  Strength at foot  Plantar-flexion: 5/5 Dorsi-flexion: 5/5 Eversion: 5/5 Inversion: 5/5  Leg strength  Quad: 5/5 Hamstring: 5/5 Hip flexor: 5/5 Hip abductors: 5/5  Gait unremarkable.  Osteopathic findings C2 flexed rotated and side bent right C6 flexed rotated and side bent left T3 extended rotated and side bent right inhaled third rib T7 extended  rotated and side bent left L2 flexed rotated and side bent right Sacrum right on right    Impression and Recommendations:     This case required medical decision making of moderate complexity. The above documentation has been reviewed and is accurate and complete Lyndal Pulley, DO       Note: This dictation was prepared with Dragon dictation along with smaller phrase technology. Any transcriptional errors that result from this process are unintentional.

## 2019-01-27 ENCOUNTER — Telehealth: Payer: Self-pay | Admitting: Obstetrics and Gynecology

## 2019-01-27 NOTE — Telephone Encounter (Signed)
Left message on voicemail to call and reschedule cancelled appointment. °

## 2019-02-20 ENCOUNTER — Ambulatory Visit: Payer: BLUE CROSS/BLUE SHIELD | Admitting: Obstetrics and Gynecology

## 2019-04-15 HISTORY — PX: ANTERIOR AND POSTERIOR REPAIR: SHX1172

## 2020-04-27 HISTORY — PX: ENDOMETRIAL BIOPSY: SHX622

## 2020-05-31 HISTORY — PX: HYSTEROSCOPY WITH D & C: SHX1775

## 2021-07-06 ENCOUNTER — Other Ambulatory Visit (HOSPITAL_COMMUNITY)
Admission: RE | Admit: 2021-07-06 | Discharge: 2021-07-06 | Disposition: A | Discharge status: Home / Self Care | Payer: BLUE CROSS/BLUE SHIELD | Source: Ambulatory Visit | Attending: Obstetrics and Gynecology | Admitting: Obstetrics and Gynecology

## 2021-07-06 ENCOUNTER — Encounter: Payer: Self-pay | Admitting: Obstetrics and Gynecology

## 2021-07-06 ENCOUNTER — Other Ambulatory Visit: Payer: Self-pay

## 2021-07-06 ENCOUNTER — Ambulatory Visit (INDEPENDENT_AMBULATORY_CARE_PROVIDER_SITE_OTHER): Payer: 59 | Admitting: Obstetrics and Gynecology

## 2021-07-06 VITALS — BP 118/67 | HR 94 | Ht 63.0 in | Wt 170.0 lb

## 2021-07-06 DIAGNOSIS — Z01419 Encounter for gynecological examination (general) (routine) without abnormal findings: Secondary | ICD-10-CM

## 2021-07-06 DIAGNOSIS — Z124 Encounter for screening for malignant neoplasm of cervix: Secondary | ICD-10-CM | POA: Diagnosis not present

## 2021-07-06 DIAGNOSIS — N816 Rectocele: Secondary | ICD-10-CM

## 2021-07-06 DIAGNOSIS — K5909 Other constipation: Secondary | ICD-10-CM | POA: Diagnosis not present

## 2021-07-06 NOTE — Patient Instructions (Signed)

## 2021-07-06 NOTE — Progress Notes (Signed)
60 y.o. [id number] [name] here for annual exam.    Had anterior/posterior repair 03/2019 Dr. Vinetta Bergamo in [place] and is still having urinary incontinence and problems emptying well with BMs.  She is now splinting.  On the fifth day after splinting, she has diarrhea.  She has done a lot of different supplements.  If she does lifting, she has some urinary leakage.  She does not have pain with intercourse anymore.  She may be interested in physical therapy again.   She had postmenopausal bleeding and she had a hysteroscopy.  She had benign endometrium. No further bleeding.   Was living in [place] area and is now back in [place].  Has grandchild here.   PCP:   Patrecia Pace.   Patient's last menstrual period was 10/26/2015 (within months).           Sexually active: Yes.    The current method of family planning is tubal ligation/Bil.oophorectomy.    Exercising: No.   Doing pelvic floor exercises Smoker:  no  Health Maintenance: Pap:   01-13-18 Neg:Neg HR HPV, 01/24/17 Negative per patient History of abnormal Pap:  no MMG:  Thermography every year--hasn't had one recently.  She has a thermogram scheduled for December, 2022.  Colonoscopy:  2017  small polyp versus "freckle" noted.   BMD:   n/a  Result  n/a TDaP:  Declines Gardasil:   no HIV:Neg in the past per patient Hep C: Neg in the past Screening Labs:  PCP.  Covid vaccine x 3.    reports that she has never smoked. She has never used smokeless tobacco. She reports that she does not currently use alcohol. She reports that she does not use drugs.  Past Medical History:  Diagnosis Date   Anxiety    Colon polyp    Depression    Endometriosis    Fibroid    Thyroid disease     Past Surgical History:  Procedure Laterality Date   ANTERIOR AND POSTERIOR REPAIR  04/15/2019   APPENDECTOMY     BREAST SURGERY     CESAREAN SECTION     ENDOMETRIAL BIOPSY  04/27/2020   benign   endomitriosis  1995    OOPHORECTOMY Right 1996   SALPINGECTOMY Bilateral 1996    Current Outpatient Medications  Medication Sig Dispense Refill   B Complex Vitamins (VITAMIN B COMPLEX PO) Take by mouth.     cetirizine (ZYRTEC) 10 MG tablet Take 1 tablet (10 mg total) by mouth daily. 30 tablet 11   Cyanocobalamin (VITAMIN B-12 PO) Take 5,000 mg by mouth daily.      EPINEPHrine 0.3 mg/0.3 mL IJ SOAJ injection Inject into the muscle.     psyllium (METAMUCIL) 58.6 % packet Take 1 packet by mouth daily.     No current facility-administered medications for this visit.    Family History  Problem Relation Age of Onset   Hyperlipidemia Mother    Hypertension Mother    Diabetes Father    Prostate cancer Father    Arthritis Sister    Rheum arthritis Sister    Heart disease Brother    Heart attack Brother    Cancer Maternal Grandmother        breast    Review of Systems  All other systems reviewed and are negative.  Exam:   BP 118/67   Pulse 94   Ht 5\' 3"  (1.6 m)   Wt 170 lb (77.1 kg)   LMP 10/26/2015 (Within Months)  SpO2 100%   BMI 30.11 kg/m     General appearance: alert, cooperative and appears stated age Head: normocephalic, without obvious abnormality, atraumatic Neck: no adenopathy, supple, symmetrical, trachea midline and thyroid normal to inspection and palpation Lungs: clear to auscultation bilaterally Breasts: normal appearance, no masses or tenderness, No nipple retraction or dimpling, No nipple discharge or bleeding, No axillary adenopathy Heart: regular rate and rhythm Abdomen: soft, non-tender; no masses, no organomegaly Extremities: extremities normal, atraumatic, no cyanosis or edema Skin: skin color, texture, turgor normal. No rashes or lesions Lymph nodes: cervical, supraclavicular, and axillary nodes normal. Neurologic: grossly normal  Pelvic: External genitalia:  no lesions              No abnormal inguinal nodes palpated.              Urethra:  normal appearing urethra with  no masses, tenderness or lesions              Bartholins and Skenes: normal                 Vagina: normal appearing vagina with normal color and discharge, no lesions.  Good anterior support.  First degree rectocele.               Cervix: scar tissue from anterior colporrhaphy versus polyps.               Pap taken: yes Bimanual Exam:  Uterus:  normal size, contour, position, consistency, mobility, non-tender              Adnexa: no mass, fullness, tenderness              Rectal exam: yes.  Confirms.              Anus:  normal sphincter tone, no lesions  Chaperone was present for exam:  Raquel Sarna, RN.   Assessment:   Well woman visit with gynecologic exam.  Status post right oophorectomy and bilateral salpingectomy. Status post anterior and posterior colporrhaphy.  First degree rectocele.  Cervical polyps versus scar tissue.  Cervical cancer screening.  Chronic constipation.  Rectocele.  Urinary incontinence.  Fibroids. Cervical stenosis.  Plan: Mammogram screening discussed.    Information on radiology centers for mammogram to patient.  Mammogram recommended.  Self breast awareness reviewed. Pap and HR HPV collected.  Guidelines for Calcium, Vitamin D, regular exercise program including cardiovascular and weight bearing exercise. Referral for pelvic floor therapy with [facility]. Referral to GI for chronic constipation.   Follow up annually and prn.   After visit summary provided.

## 2021-07-07 ENCOUNTER — Telehealth: Payer: Self-pay | Admitting: Obstetrics and Gynecology

## 2021-07-07 DIAGNOSIS — N816 Rectocele: Secondary | ICD-10-CM

## 2021-07-07 DIAGNOSIS — N39498 Other specified urinary incontinence: Secondary | ICD-10-CM

## 2021-07-07 NOTE — Telephone Encounter (Signed)
Please make a referral to [facility] Pelvic Floor Therapy for rectocele and urinary incontinence.   Patient has had an anterior and posterior colporrhaphy.

## 2021-07-10 LAB — CYTOLOGY - PAP
Comment: NEGATIVE
Diagnosis: NEGATIVE
High risk HPV: NEGATIVE

## 2021-07-11 NOTE — Telephone Encounter (Signed)
Referral faxed to [facility] °

## 2021-07-13 ENCOUNTER — Encounter: Payer: Self-pay | Admitting: Gastroenterology

## 2021-07-18 NOTE — Telephone Encounter (Signed)
Please check with the patient to see if that would be acceptable to her.  I am ok with [facility] pelvic floor therapy.

## 2021-07-18 NOTE — Telephone Encounter (Signed)
Patient was fine with referral at [facility], referral placed, number given to patient to call and schedule.

## 2021-07-18 NOTE — Telephone Encounter (Signed)
[facility] sent fax regarding referral stating the physical therapist is not contracted with patients insurance carrier. I did call the patient and relay this information to her as well. Are you okay with referral to [facility]?

## 2021-07-25 NOTE — Telephone Encounter (Signed)
Patient scheduled on 09/01/21

## 2021-08-06 ENCOUNTER — Encounter: Payer: Self-pay | Admitting: Obstetrics and Gynecology

## 2021-08-30 ENCOUNTER — Ambulatory Visit: Payer: 59 | Admitting: Gastroenterology

## 2021-08-30 ENCOUNTER — Encounter: Payer: Self-pay | Admitting: Gastroenterology

## 2021-08-30 VITALS — BP 134/90 | HR 76 | Ht 65.0 in | Wt 169.0 lb

## 2021-08-30 DIAGNOSIS — R198 Other specified symptoms and signs involving the digestive system and abdomen: Secondary | ICD-10-CM | POA: Diagnosis not present

## 2021-08-30 DIAGNOSIS — M6289 Other specified disorders of muscle: Secondary | ICD-10-CM | POA: Diagnosis not present

## 2021-08-30 DIAGNOSIS — K5902 Outlet dysfunction constipation: Secondary | ICD-10-CM

## 2021-08-30 DIAGNOSIS — R278 Other lack of coordination: Secondary | ICD-10-CM

## 2021-08-30 NOTE — Progress Notes (Signed)
Amy Burton    [id number]    [birth year]  Primary Care Physician:Morrison, Corliss Skains, FNP  Referring Physician: Joya Gaskins, [address] [place],  [place] [address]   Chief complaint:  IBS alternating constipation and diarrhea, rectocele  HPI:  61 year old very pleasant female here for new patient visit with complaints of alternating constipation and diarrhea. Has had irregular bowel habits throughout her life.  She is s/p anterior and posterior repair in August 2020 for pelvic floor prolapse.  She developed severe constipation postop and also has history of internal anal sphincter tear from childbirth. Bowel habits have improved to some extent post pelvic floor surgery and physical therapy but she continues to struggle with incomplete evacuation and alternating constipation with diarrhea  Last Colonoscopy 7 years ago at [facility], report is not available to review but was normal per patient, had a benign polyp removed  Denies any rectal bleeding, melena, unintentional weight loss or vomiting.  Her son has Crohn's disease   Outpatient Encounter Medications as of 08/30/2021  Medication Sig   B Complex Vitamins (VITAMIN B COMPLEX PO) Take by mouth.   cetirizine (ZYRTEC) 10 MG tablet Take 1 tablet (10 mg total) by mouth daily.   Cyanocobalamin (VITAMIN B-12 PO) Take 5,000 mg by mouth daily.    EPINEPHrine 0.3 mg/0.3 mL IJ SOAJ injection Inject into the muscle.   psyllium (METAMUCIL) 58.6 % packet Take 1 packet by mouth daily.   No facility-administered encounter medications on file as of 08/30/2021.    Allergies as of 08/30/2021 - Review Complete 08/30/2021  Allergen Reaction Noted   Sulfa antibiotics  09/29/2015    Past Medical History:  Diagnosis Date   Anxiety    Colon polyp    Depression    Endometriosis    Fibroid    Thyroid disease     Past Surgical History:  Procedure Laterality Date   ANTERIOR AND POSTERIOR REPAIR   04/15/2019   APPENDECTOMY     BREAST SURGERY     CESAREAN SECTION     ENDOMETRIAL BIOPSY  04/27/2020   benign   endomitriosis  1995   HYSTEROSCOPY WITH D & C  05/31/2020   benign   OOPHORECTOMY Right 1996   SALPINGECTOMY Bilateral 1996    Family History  Problem Relation Age of Onset   Hyperlipidemia Mother    Hypertension Mother    Diabetes Father    Prostate cancer Father    Arthritis Sister    Rheum arthritis Sister    Heart disease Brother    Heart attack Brother    Cancer Maternal Grandmother        breast    Social History   Socioeconomic History   Marital status: Married    Spouse name: Not on file   Number of children: Not on file   Years of education: Not on file   Highest education level: Not on file  Occupational History   Not on file  Tobacco Use   Smoking status: Never   Smokeless tobacco: Never  Vaping Use   Vaping Use: Never used  Substance and Sexual Activity   Alcohol use: Not Currently   Drug use: No   Sexual activity: Yes    Birth control/protection: Surgical    Comment: Salpingectomy/Oophorectomy   Other Topics Concern   Not on file  Social History Narrative   Not on file   Social Determinants of Health  Financial Resource Strain: Not on file  Food Insecurity: Not on file  Transportation Needs: Not on file  Physical Activity: Not on file  Stress: Not on file  Social Connections: Not on file  Intimate Partner Violence: Not on file      Review of systems: All other review of systems negative except as mentioned in the HPI.   Physical Exam: Vitals:   08/30/21 1007  BP: 134/90  Pulse: 76   Body mass index is 28.12 kg/m. Gen:      No acute distress HEENT:  sclera anicteric Abd:      soft, non-tender; no palpable masses, no distension Ext:    No edema Neuro: alert and oriented x 3 Psych: normal mood and affect Rectal exam: Decreased anal sphincter tone, posterior chronic anal fissure and external  hemorrhoids Paradoxical contraction of anal sphincter during attempted defecation  Data Reviewed:  Reviewed labs, radiology imaging, old records and pertinent past GI work up   Assessment and Plan/Recommendations:  61 year old very pleasant female with chronic irritable bowel syndrome with alternating constipation and diarrhea, pelvic floor dysfunction and dyssynergic defecation Follow-up with pelvic floor physical therapy and biofeedback If continues to have persistent symptoms, will obtain anorectal manometry to further evaluate Use Benefiber 1 tablespoon twice daily with meals  7 years since last colonoscopy, benign polyp was removed.  We will try to obtain records s plan to proceed with repeat colonoscopy to exclude any neoplastic lesion given sensation of incomplete evacuation and changes in bowel habits in the past 1 to 2 years The risks and benefits as well as alternatives of endoscopic procedure(s) have been discussed and reviewed. All questions answered. The patient agrees to proceed.   Return in 2 months.  The patient was provided an opportunity to ask questions and all were answered. The patient agreed with the plan and demonstrated an understanding of the instructions.  Damaris Hippo , MD    CC: Joya Gaskins, *

## 2021-08-30 NOTE — Patient Instructions (Addendum)
You have been scheduled for a colonoscopy. Please follow written instructions given to you at your visit today.  Please pick up your prep supplies at the pharmacy within the next 1-3 days. If you use inhalers (even only as needed), please bring them with you on the day of your procedure.  If you are age 62 or older, your body mass index should be between 23-30. Your Body mass index is 28.12 kg/m. If this is out of the aforementioned range listed, please consider follow up with your Primary Care Provider.  If you are age 77 or younger, your body mass index should be between 19-25. Your Body mass index is 28.12 kg/m. If this is out of the aformentioned range listed, please consider follow up with your Primary Care Provider.   ________________________________________________________  Take Bonne Dolores 1 tablespoon twice a day with meals  Follow up with Pelvic Floor PT   The [facility] providers would like to encourage you to use [facility] to communicate with providers for non-urgent requests or questions.  Due to long hold times on the telephone, sending your provider a message by [facility] may be a faster and more efficient way to get a response.  Please allow 48 business hours for a response.  Please remember that this is for non-urgent requests.  _______________________________________________________   I appreciate the  opportunity to care for you  Thank You   Harl Bowie , MD

## 2021-08-31 ENCOUNTER — Encounter: Payer: Self-pay | Admitting: Gastroenterology

## 2021-09-01 ENCOUNTER — Other Ambulatory Visit: Payer: Self-pay

## 2021-09-01 ENCOUNTER — Encounter: Payer: Self-pay | Admitting: Physical Therapy

## 2021-09-01 ENCOUNTER — Ambulatory Visit: Payer: 59 | Attending: Obstetrics and Gynecology | Admitting: Physical Therapy

## 2021-09-01 DIAGNOSIS — N39498 Other specified urinary incontinence: Secondary | ICD-10-CM | POA: Insufficient documentation

## 2021-09-01 DIAGNOSIS — N816 Rectocele: Secondary | ICD-10-CM | POA: Diagnosis not present

## 2021-09-01 DIAGNOSIS — R278 Other lack of coordination: Secondary | ICD-10-CM | POA: Diagnosis not present

## 2021-09-01 DIAGNOSIS — M6281 Muscle weakness (generalized): Secondary | ICD-10-CM | POA: Diagnosis not present

## 2021-09-02 NOTE — Therapy (Signed)
[facility] @ [place], [place], [address] Phone: [phone]   Fax:  [phone]  Physical Therapy Evaluation  Patient Details  Name: Amy Burton MRN: [id number] Date of Birth: 15-Oct-1960 Referring Provider (PT): Dr. Aundria Rud   Encounter Date: 09/01/2021   PT End of Session - 09/01/21 2308     Visit Number 1    Date for PT Re-Evaluation 12/30/21    Authorization Type [employer]    PT Start Time 1100    PT Stop Time 1150    PT Time Calculation (min) 50 min    Activity Tolerance Patient tolerated treatment well;No increased pain    Behavior During Therapy [facility] for tasks assessed/performed             Past Medical History:  Diagnosis Date   Anxiety    Colon polyp    Depression    Endometriosis    Fibroid    Thyroid disease     Past Surgical History:  Procedure Laterality Date   ANTERIOR AND POSTERIOR REPAIR  04/15/2019   APPENDECTOMY     BREAST SURGERY     CESAREAN SECTION     ENDOMETRIAL BIOPSY  04/27/2020   benign   endomitriosis  1995   HYSTEROSCOPY WITH D & C  05/31/2020   benign   OOPHORECTOMY Right 1996   SALPINGECTOMY Bilateral 1996    There were no vitals filed for this visit.    Subjective Assessment - 09/01/21 1104     Subjective My rectocele came back a little from the straining. I am pushing the wrong way with wrong muscles. The pelvic floor surgeon said she could feel the tear on the anal sphincter muscles. I get stressed out when I do not have a bowel movement. Patient has to splint to have a bowel movement. Does not feel she fully emptys her bowels but really does. No urinary leakage. Patient has to strain to have a bowel movement. Patient has gas that does not smell. Stool type 2-5. She has terrible hemorroids.    Patient Stated Goals to have a normal bowel movements,    Currently in Pain? No/denies    Multiple Pain Sites No                [facility] PT  Assessment - 09/01/21 0001       Assessment   Medical Diagnosis n81.6 REctocele; 39.498 Other urinary incontinence    Referring Provider (PT) Dr. Aundria Rud    Onset Date/Surgical Date --   04/13/2019   Prior Therapy yes      Precautions   Precautions None      Restrictions   Weight Bearing Restrictions No      Balance Screen   Has the patient fallen in the past 6 months No    Has the patient had a decrease in activity level because of a fear of falling?  No    Is the patient reluctant to leave their home because of a fear of falling?  No      Home [employer] residence      Prior Function   Level of Independence Independent      Cognition   Overall Cognitive Status Within Functional Limits for tasks assessed      Observation/Other Assessments   Skin Integrity decreased mobility of lower abdominal scar      Posture/Postural Control   Posture/Postural Control No  significant limitations      ROM / Strength   AROM / PROM / Strength AROM;PROM;Strength      Strength   Right Hip Flexion 4/5    Right Hip Extension 4/5    Right Hip ABduction 3+/5    Left Hip Flexion 4/5    Left Hip Extension 4/5    Left Hip ABduction 3/5      Palpation   SI assessment  left ilium rotated posteriorly; sacrum rotated right; decreased springing of the left SI joint    Palpation comment increased rib angle; decreased rib mobility with breath                        Objective measurements completed on examination: See above findings.     Pelvic Floor Special Questions - 09/01/21 0001     Currently Sexually Active Yes    Is this Painful No    Urinary Leakage No    Fecal incontinence No   straining   Skin Integrity Intact;Hemorroids    Scar Restricted    Pelvic Floor Internal Exam Patient confirmed identification and approves PT to assess pelvic floor and treatment    Exam Type Rectal    Palpation unable to bulge the anus to push  the therapist finger out of the canal; tightness in the anococcygeal ligament, along the puborectalis and iliococcygeus; restrictions of the anterior rectum    Strength fair squeeze, definite lift              [facility] Adult PT Treatment/Exercise - 09/01/21 0001       Manual Therapy   Manual Therapy Internal Pelvic Floor    Internal Pelvic Floor manual work on the anococcygeal ligament, along the puborectalis, and iliococcygeus with one hand internal and one finger external to release through the restrictions.                       PT Short Term Goals - 09/01/21 2357       PT SHORT TERM GOAL #1   Title independent with initial HEP with flexibility exercises    Time 4    Period Weeks    Status New    Target Date 09/29/21      PT SHORT TERM GOAL #2   Title understand correct toileting technique to reduce strain on the pelvic floor    Time 4    Period Weeks    Status New    Target Date 09/29/21      PT SHORT TERM GOAL #3   Title how to splint the pelvic floor to reduce rectocele while bathrooming    Time 4    Period Weeks    Status New    Target Date 09/29/21      PT SHORT TERM GOAL #4   Title understand how to perform abdominal massage to promote improved bowel health and decrease strain on the pelvic floor    Time 4    Period Weeks    Status New    Target Date 09/29/21               PT Long Term Goals - 09/01/21 2358       PT LONG TERM GOAL #1   Title independent with HEP and understand how to progress herself    Baseline --    Time 4    Period Months    Status New    Target Date 12/30/21  PT LONG TERM GOAL #2   Title able to bulge the pelvis floor correctly to push the stool out and not strain    Baseline --    Time 4    Period Months    Status New    Target Date 12/31/21      PT LONG TERM GOAL #3   Title straining tp jave a bowel movement decreased >/= 75%    Baseline --    Time 4    Period Months    Status New    Target  Date 12/31/21      PT LONG TERM GOAL #4   Title bilateral hip abduction >/= 4/5 to assist abdominals to contract  to have a bowel movement correctlu    Baseline ---    Time 4    Period Months    Status New    Target Date 12/31/21                    Plan - 09/01/21 2310     Clinical Impression Statement Patient  is a 61 year old female with a rectocele and difficulty with having a bowel movement without straining since she had had surgery for anterior and posterior repair on 04/15/2019. Patient may not have a bowel movement for 3-4 days. Since her surgery her rectocel is present at grade 1 according to her doctors. Pelvic floor strength with external anal sphinter is 3/5 and the puborectalis is 2/5. She has tighteness in the anococcygeal ligament, puobrectalis, and iliococcygeus. Coccyx is sidebend to the right. Left ilium is rotated posteriorly. Left SI joint is limited and sacrum is rotated right. Patient is unable to push therapist finger out of the anal canal. Rib cage angle is greater than 90 degrees and decreased mobility of lower rib cage. Bilateral hip abduction is 3/5 and extension is 4/5. Patient has to splint to have a bowel movement. Patient will benefit from a skilled therapy to improve pelvic floor coordination to improve bowel movements and reducing straining.    Personal Factors and Comorbidities Comorbidity 3+;Past/Current Experience    Comorbidities anterior and posterior repair 04/15/2019; appendectomey; c-section    Examination-Activity Limitations Toileting    Stability/Clinical Decision Making Stable/Uncomplicated    Clinical Decision Making Low    Rehab Potential Excellent    PT Frequency 1x / week    PT Duration Other (comment)   4 months   PT Treatment/Interventions ADLs/Self Care Home Management;Biofeedback;Therapeutic activities;Therapeutic exercise;Neuromuscular re-education;Patient/family education;Manual techniques;Dry needling;Taping;Joint Manipulations     PT Next Visit Plan rib mobilization to reduce the rib angle; diaphramatic  breathing, anal work; correct pelvis    Consulted and Agree with Plan of Care Patient             Patient will benefit from skilled therapeutic intervention in order to improve the following deficits and impairments:  Decreased coordination, Increased fascial restricitons, Decreased strength, Decreased activity tolerance  Visit Diagnosis: Other lack of coordination - Plan: PT plan of care cert/re-cert  Muscle weakness (generalized) - Plan: PT plan of care cert/re-cert     Problem List Patient Active Problem List   Diagnosis Date Noted   Dysuria 10/22/2017   Nonallopathic lesion of thoracic region 05/15/2017   Nonallopathic lesion of cervical region 05/15/2017   Nonallopathic lesion of sacral region 05/15/2017   Chronic neck pain 04/30/2017   Chronic back pain 04/30/2017   Arthralgia of hip 04/30/2017   Pelvic prolapse 04/30/2017   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  Eczema 09/07/2016   Urinary urgency 09/07/2016   Chronic sinusitis 09/29/2015   Allergic rhinitis 09/29/2015   Prediabetes 09/29/2015   Vitamin D deficiency 09/29/2015   Situational anxiety-flying 09/29/2015   ETD (Eustachian tube dysfunction), bilateral 09/29/2015    Earlie Counts, PT 09/02/21 12:08 AM  [facility] @ [place], [place], [address] Phone: [phone]   Fax:  [phone]  Name: Amy Burton MRN: [id number] Date of Birth: May 18, 1961

## 2021-09-04 ENCOUNTER — Encounter: Payer: Self-pay | Admitting: Physical Therapy

## 2021-09-04 ENCOUNTER — Other Ambulatory Visit: Payer: Self-pay

## 2021-09-04 ENCOUNTER — Ambulatory Visit: Payer: 59 | Admitting: Physical Therapy

## 2021-09-04 DIAGNOSIS — M6281 Muscle weakness (generalized): Secondary | ICD-10-CM

## 2021-09-04 DIAGNOSIS — R278 Other lack of coordination: Secondary | ICD-10-CM

## 2021-09-04 NOTE — Therapy (Signed)
[facility] @ [place], [place], [address] Phone: [phone]   Fax:  [phone]  Physical Therapy Treatment  Patient Details  Name: Amy Burton MRN: [id number] Date of Birth: 05-Jul-1961 Referring Provider (PT): Dr. Aundria Rud   Encounter Date: 09/04/2021   PT End of Session - 09/04/21 0842     Visit Number 2    Date for PT Re-Evaluation 12/30/21    Authorization Type [employer]    PT Start Time 0800    PT Stop Time 0840    PT Time Calculation (min) 40 min    Activity Tolerance Patient tolerated treatment well;No increased pain    Behavior During Therapy [facility] for tasks assessed/performed             Past Medical History:  Diagnosis Date   Anxiety    Colon polyp    Depression    Endometriosis    Fibroid    Thyroid disease     Past Surgical History:  Procedure Laterality Date   ANTERIOR AND POSTERIOR REPAIR  04/15/2019   APPENDECTOMY     BREAST SURGERY     CESAREAN SECTION     ENDOMETRIAL BIOPSY  04/27/2020   benign   endomitriosis  1995   HYSTEROSCOPY WITH D & C  05/31/2020   benign   OOPHORECTOMY Right 1996   SALPINGECTOMY Bilateral 1996    There were no vitals filed for this visit.   Subjective Assessment - 09/04/21 0804     Subjective I feel great right now. I had 3 bowel movements with less straining. I had to splint to get the bowel movement started.    Patient Stated Goals to have a normal bowel movements,    Currently in Pain? No/denies    Multiple Pain Sites No                               [facility] Adult PT Treatment/Exercise - 09/04/21 0001       Therapeutic Activites    Therapeutic Activities Other Therapeutic Activities    Other Therapeutic Activities education on correct toileting to relax the pelvic floor, correct breath, knees above hips      Lumbar Exercises: Seated   Other Seated Lumbar Exercises seated HA breath to work on 360  degree breath and moveing the rib cage downward      Lumbar Exercises: Supine   Other Supine Lumbar Exercises lay on back with tennis balls along the thoracic vertebrae to mobilize the area      Lumbar Exercises: Sidelying   Other Sidelying Lumbar Exercises sidely 360 breath with rib expansion and reducing the rib angle      Manual Therapy   Manual Therapy Soft tissue mobilization;Myofascial release    Soft tissue mobilization soft tissue work to the posterior intercoastals, thoacic and lumbar paraspinals    Myofascial Release using suction cup to the mid ot low thoracic area posteriorly                     PT Education - 09/04/21 0841     Education Details correct toileting; HA breath, 360 degree breath in sidely; supine rib mobilization with tennis balls    Person(s) Educated Patient    Methods Explanation;Demonstration;Handout    Comprehension Verbalized understanding;Returned demonstration              PT Short Term Goals -  09/01/21 2357       PT SHORT TERM GOAL #1   Title independent with initial HEP with flexibility exercises    Time 4    Period Weeks    Status New    Target Date 09/29/21      PT SHORT TERM GOAL #2   Title understand correct toileting technique to reduce strain on the pelvic floor    Time 4    Period Weeks    Status New    Target Date 09/29/21      PT SHORT TERM GOAL #3   Title how to splint the pelvic floor to reduce rectocele while bathrooming    Time 4    Period Weeks    Status New    Target Date 09/29/21      PT SHORT TERM GOAL #4   Title understand how to perform abdominal massage to promote improved bowel health and decrease strain on the pelvic floor    Time 4    Period Weeks    Status New    Target Date 09/29/21               PT Long Term Goals - 09/01/21 2358       PT LONG TERM GOAL #1   Title independent with HEP and understand how to progress herself    Baseline --    Time 4    Period Months     Status New    Target Date 12/30/21      PT LONG TERM GOAL #2   Title able to bulge the pelvis floor correctly to push the stool out and not strain    Baseline --    Time 4    Period Months    Status New    Target Date 12/31/21      PT LONG TERM GOAL #3   Title straining tp jave a bowel movement decreased >/= 75%    Baseline --    Time 4    Period Months    Status New    Target Date 12/31/21      PT LONG TERM GOAL #4   Title bilateral hip abduction >/= 4/5 to assist abdominals to contract  to have a bowel movement correctlu    Baseline ---    Time 4    Period Months    Status New    Target Date 12/31/21                   Plan - 09/04/21 0842     Clinical Impression Statement Patient is splinting to initiate the bowel movement instead of trying to have a full bowel movement. Patient feels great today and relaxed in the anal area. Patient was educted on using breath to assist with bowel movements and how to perform 360 breathing. Patient had fascial restrictions in the lower thoracic area posteriorly. She would hinge at the thoracic lumbar junction and is learning to open up the area. Patient will benefit from skilled therapy to improve pelvic floor coordination to improve bowel movements and reducing straining.    Personal Factors and Comorbidities Comorbidity 3+;Past/Current Experience    Comorbidities anterior and posterior repair 04/15/2019; appendectomey; c-section    Examination-Activity Limitations Toileting    Stability/Clinical Decision Making Stable/Uncomplicated    Rehab Potential Excellent    PT Frequency 1x / week    PT Duration Other (comment)   4 months   PT Treatment/Interventions ADLs/Self Care Home Management;Biofeedback;Therapeutic activities;Therapeutic  exercise;Neuromuscular re-education;Patient/family education;Manual techniques;Dry needling;Taping;Joint Manipulations    PT Next Visit Plan work internally through the anus and pushing the therapist  finger out, hip abduction strength; work on reducing the rib cage angle    Consulted and Agree with Plan of Care Patient             Patient will benefit from skilled therapeutic intervention in order to improve the following deficits and impairments:  Decreased coordination, Increased fascial restricitons, Decreased strength, Decreased activity tolerance  Visit Diagnosis: Other lack of coordination  Muscle weakness (generalized)     Problem List Patient Active Problem List   Diagnosis Date Noted   Dysuria 10/22/2017   Nonallopathic lesion of thoracic region 05/15/2017   Nonallopathic lesion of cervical region 05/15/2017   Nonallopathic lesion of sacral region 05/15/2017   Chronic neck pain 04/30/2017   Chronic back pain 04/30/2017   Arthralgia of hip 04/30/2017   Pelvic prolapse 04/30/2017   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   Eczema 09/07/2016   Urinary urgency 09/07/2016   Chronic sinusitis 09/29/2015   Allergic rhinitis 09/29/2015   Prediabetes 09/29/2015   Vitamin D deficiency 09/29/2015   Situational anxiety-flying 09/29/2015   ETD (Eustachian tube dysfunction), bilateral 09/29/2015    Earlie Counts, PT 09/04/21 8:46 AM  [facility] @ [place], [place], [address] Phone: [phone]   Fax:  [phone]  Name: Gabbriella Presswood MRN: [id number] Date of Birth: 04-03-1961

## 2021-09-04 NOTE — Patient Instructions (Signed)
Toileting Techniques for Bowel Movements    An Evacuation/Defecation Plan   Here are the 4 basic points:  Lean forward enough for your elbows to rest on your knees Support your feet on the floor or use a low stool if your feet don't touch the floor  Push out your belly as if you have swallowed a beach ball--you should feel a widening of your waist. "Belly Big, Belly Hard" Open and relax your pelvic floor muscles, rather than tightening around the anus  While you are sitting on the toilet pay attention to the following areas: Jaw and mouth position- relaxed not clenched Angle of your hips - leaning slightly forward Whether your feet touch the ground or not - should be flat and supported Arm placement - rest against your thighs Spine position - flat back Waist Breathing - exhale as you push (like blowing up a balloon or try using other sounds such as ahhhh, shhhhh, ohhhh or grrrrrrr) Beulah Gandy - hard and tight as you push Anus (opening of the anal canal) - relaxed and open as you push Anus - Tighten and lift pulling the muscle back in after you are done or if taking a break  If you are not successful after 10-15 minutes, try again later.  Avoid negative self-talk about your toileting experience.   Read this for more details and ask your PT if you need suggestions for adjustments or limitations:  Sitting on the toilet  a) Make sure your feet are supported - flat on the floor or step stool b) Many people find it effective to lean forward or raise their knees.  Propping your feet on a step stool (Squatty Potty is a brand name) can help the muscles around the anus to relax  c) When you lean forward, place your forearms on your thighs for support  Relaxing Breathe deeply and slowly in through your nose and out through your mouth. To become aware of how to relax your muscles, contracting and releasing muscles can be helpful.  Pull your pelvic floor muscles in tightly by using the image of  holding back gas, or closing around the anus (visualize making a circle smaller) and lifting the anus up and in.  Then release the muscles and your anus should drop down and feel open. Repeat 5 times ending with the feeling of relaxation. Keep your pelvic floor muscles relaxed; let your belly bulge out. The digestive tract starts at the mouth and ends at the anal opening, so be sure to relax both ends of the tube.  Place your tongue on the roof of your mouth with your teeth separated.  This helps relax your mouth and will help to relax the anus at the same time.  Emptying (defecation) a) Keep your pelvic floor and sphincter relaxed, then bulge your anal muscles.  Make the anal opening wide.  b) Stick your belly out as if you have swallowed a beach ball. c) Make your belly wall hard using your belly muscles while continuing to breathe. Doing this makes it easier to open your anus. d) Breath out and give a grunt (or try using other sounds such as ahhhh, shhhhh, ohhhh or grrrrrrr). e)  Can also try to act as if you are blowing up a balloon as you push  4) Finishing a) As you finish your bowel movement, pull the pelvic floor muscles up and in.  This will leave your anus in the proper place rather than remaining pushed out and down. If  you leave your anus pushed out and down, it will start to feel as though that is normal and give you incorrect signals about needing to have a bowel movement.  [facility] [address], [place], [place] [address] Phone # [phone] Fax [phone]

## 2021-09-13 ENCOUNTER — Other Ambulatory Visit: Payer: Self-pay

## 2021-09-13 ENCOUNTER — Encounter: Payer: Self-pay | Admitting: Physical Therapy

## 2021-09-13 ENCOUNTER — Ambulatory Visit: Payer: 59 | Admitting: Physical Therapy

## 2021-09-13 DIAGNOSIS — R278 Other lack of coordination: Secondary | ICD-10-CM | POA: Diagnosis not present

## 2021-09-13 DIAGNOSIS — M6281 Muscle weakness (generalized): Secondary | ICD-10-CM

## 2021-09-13 NOTE — Patient Instructions (Signed)
Access Code: [email or web address] URL: [email or web address] Date: 09/13/2021 Prepared by: Earlie Counts  Exercises Supine Hamstring Stretch - 1 x daily - 3 x weekly - 1 sets - 2 reps - 30 sec hold Supine Piriformis Stretch with Leg Straight - 1 x daily - 3 x weekly - 2 sets - 2 reps - 30 sec hold V Sit Hip Adductor Hamstring Stretch - 1 x daily - 3 x weekly - 1 sets - 2 reps - 30 sec hold Standing Quadriceps Stretch - 1 x daily - 3 x weekly - 2 sets - 2 reps - 30 sec hold Supine Transversus Abdominis Bracing - Hands on Stomach - 1 x daily - 7 x weekly - 1 sets - 10 reps Prone Hip Abduction on Slider - 1 x daily - 3 x weekly - 2 sets - 10 reps [facility] [address], [place], [place] [address] Phone # [phone] Fax [phone]

## 2021-09-13 NOTE — Therapy (Signed)
[facility] @ [place], [place], [address] Phone: [phone]   Fax:  [phone]  Physical Therapy Treatment  Patient Details  Name: Amy Burton MRN: [id number] Date of Birth: 08/25/1961 Referring Provider (PT): Dr. Aundria Rud   Encounter Date: 09/13/2021   PT End of Session - 09/13/21 1059     Visit Number 3    Date for PT Re-Evaluation 12/30/21    Authorization Type [employer]    Authorization - Visit Number 3    Authorization - Number of Visits 30    PT Start Time 2500    PT Stop Time 3704    PT Time Calculation (min) 43 min    Activity Tolerance Patient tolerated treatment well;No increased pain    Behavior During Therapy [facility] for tasks assessed/performed             Past Medical History:  Diagnosis Date   Anxiety    Colon polyp    Depression    Endometriosis    Fibroid    Thyroid disease     Past Surgical History:  Procedure Laterality Date   ANTERIOR AND POSTERIOR REPAIR  04/15/2019   APPENDECTOMY     BREAST SURGERY     CESAREAN SECTION     ENDOMETRIAL BIOPSY  04/27/2020   benign   endomitriosis  1995   HYSTEROSCOPY WITH D & C  05/31/2020   benign   OOPHORECTOMY Right 1996   SALPINGECTOMY Bilateral 1996    There were no vitals filed for this visit.   Subjective Assessment - 09/13/21 1016     Subjective I feel like I am seeing a differencr. I can do the exercises. I can feel the air coming into my back. I feel the bowel movement going easier. I am splinting less. I am able to push the stool out.    Patient Stated Goals to have a normal bowel movements,    Currently in Pain? No/denies    Multiple Pain Sites No                            Pelvic Floor Special Questions - 09/13/21 0001     Pelvic Floor Internal Exam Patient confirmed identification and approves PT to assess pelvic floor and treatment    Exam Type Rectal    Palpation thickness  along the posterior canal of the rectum    Strength fair squeeze, definite lift   anterior is less of a 3/5              [facility] Adult PT Treatment/Exercise - 09/13/21 0001       Neuro Re-ed    Neuro Re-ed Details  laying on her left side with knees to ches and practicing her breath to push the therapist finger out      Lumbar Exercises: Stretches   Active Hamstring Stretch Right;Left;1 rep;30 seconds    Active Hamstring Stretch Limitations supine    Piriformis Stretch Right;Left;1 rep;30 seconds    Piriformis Stretch Limitations supine bring leg across body    Other Lumbar Stretch Exercise hip adductor stretch 30 sec in sitting      Lumbar Exercises: Supine   Ab Set 15 reps;5 seconds    AB Set Limitations usng ball squeeze and pillow under pelvis      Lumbar Exercises: Prone   Other Prone Lumbar Exercises prone hip abduction with pillow under abdomen 10x each  side      Manual Therapy   Manual Therapy Internal Pelvic Floor    Internal Pelvic Floor manual work to the posterior rectum and left side                     PT Education - 09/13/21 1042     Education Details Access Code: [email or web address]    Person(s) Educated Patient    Methods Explanation;Demonstration;Verbal cues;Handout    Comprehension Returned demonstration;Verbalized understanding              PT Short Term Goals - 09/13/21 1156       PT SHORT TERM GOAL #1   Title independent with initial HEP with flexibility exercises    Time 4    Period Weeks    Status On-going    Target Date 09/29/21      PT SHORT TERM GOAL #2   Title understand correct toileting technique to reduce strain on the pelvic floor    Time 4    Period Weeks    Status Achieved      PT SHORT TERM GOAL #3   Title how to splint the pelvic floor to reduce rectocele while bathrooming    Time 4    Period Weeks    Status Achieved    Target Date 09/29/21      PT SHORT TERM GOAL #4   Title understand how to perform abdominal  massage to promote improved bowel health and decrease strain on the pelvic floor    Time 4    Period Weeks    Status On-going               PT Long Term Goals - 09/01/21 2358       PT LONG TERM GOAL #1   Title independent with HEP and understand how to progress herself    Baseline --    Time 4    Period Months    Status New    Target Date 12/30/21      PT LONG TERM GOAL #2   Title able to bulge the pelvis floor correctly to push the stool out and not strain    Baseline --    Time 4    Period Months    Status New    Target Date 12/31/21      PT LONG TERM GOAL #3   Title straining tp jave a bowel movement decreased >/= 75%    Baseline --    Time 4    Period Months    Status New    Target Date 12/31/21      PT LONG TERM GOAL #4   Title bilateral hip abduction >/= 4/5 to assist abdominals to contract  to have a bowel movement correctlu    Baseline ---    Time 4    Period Months    Status New    Target Date 12/31/21                   Plan - 09/13/21 1021     Clinical Impression Statement Patient was able to open up her anus better to push the therapist finger out. She has some restrictions on the posterior and left anus. She does not contract the anterior anus as well. Patient practice to engage her lower abdominals. She was able to do the prone hip abduction well with abdominal bracing. Patient is able to strain less and splint less with bowel movements. Patient  will benefit from skilled therapy to improve pelvic floor coordination to improve bowel movements and reduce straining.    Personal Factors and Comorbidities Comorbidity 3+;Past/Current Experience    Comorbidities anterior and posterior repair 04/15/2019; appendectomey; c-section    Examination-Activity Limitations Toileting    Stability/Clinical Decision Making Stable/Uncomplicated    Rehab Potential Excellent    PT Frequency 1x / week    PT Duration Other (comment)   4 months   PT  Treatment/Interventions ADLs/Self Care Home Management;Biofeedback;Therapeutic activities;Therapeutic exercise;Neuromuscular re-education;Patient/family education;Manual techniques;Dry needling;Taping;Joint Manipulations    PT Next Visit Plan work internally through the anus and pushing the therapist finger out, advance core strength with legs moving and arms,  work on reducing the rib cage angle; go over abdominal massage    PT Home Exercise Plan Access Code: [email or web address]    Recommended Other Services MD signed initial eval    Consulted and Agree with Plan of Care Patient             Patient will benefit from skilled therapeutic intervention in order to improve the following deficits and impairments:  Decreased coordination, Increased fascial restricitons, Decreased strength, Decreased activity tolerance  Visit Diagnosis: Other lack of coordination  Muscle weakness (generalized)     Problem List Patient Active Problem List   Diagnosis Date Noted   Dysuria 10/22/2017   Nonallopathic lesion of thoracic region 05/15/2017   Nonallopathic lesion of cervical region 05/15/2017   Nonallopathic lesion of sacral region 05/15/2017   Chronic neck pain 04/30/2017   Chronic back pain 04/30/2017   Arthralgia of hip 04/30/2017   Pelvic prolapse 04/30/2017   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   Eczema 09/07/2016   Urinary urgency 09/07/2016   Chronic sinusitis 09/29/2015   Allergic rhinitis 09/29/2015   Prediabetes 09/29/2015   Vitamin D deficiency 09/29/2015   Situational anxiety-flying 09/29/2015   ETD (Eustachian tube dysfunction), bilateral 09/29/2015    Earlie Counts, PT 09/13/21 11:58 AM  [facility] @ [place], [place], [address] Phone: [phone]   Fax:  [phone]  Name: Amy Burton MRN: [id number] Date of Birth: 25-May-1961

## 2021-09-18 ENCOUNTER — Other Ambulatory Visit: Payer: Self-pay

## 2021-09-18 ENCOUNTER — Encounter: Payer: Self-pay | Admitting: Physical Therapy

## 2021-09-18 ENCOUNTER — Ambulatory Visit: Payer: 59 | Admitting: Physical Therapy

## 2021-09-18 DIAGNOSIS — M6281 Muscle weakness (generalized): Secondary | ICD-10-CM

## 2021-09-18 DIAGNOSIS — R278 Other lack of coordination: Secondary | ICD-10-CM | POA: Diagnosis not present

## 2021-09-18 NOTE — Patient Instructions (Addendum)
Access Code: [email or web address] URL: [email or web address] Date: 09/18/2021 Prepared by: Earlie Counts  Exercises Supine Hamstring Stretch - 1 x daily - 3 x weekly - 1 sets - 2 reps - 30 sec hold Supine Piriformis Stretch with Leg Straight - 1 x daily - 3 x weekly - 2 sets - 2 reps - 30 sec hold V Sit Hip Adductor Hamstring Stretch - 1 x daily - 3 x weekly - 1 sets - 2 reps - 30 sec hold Standing Quadriceps Stretch - 1 x daily - 3 x weekly - 2 sets - 2 reps - 30 sec hold Supine Transversus Abdominis Bracing - Hands on Stomach - 1 x daily - 7 x weekly - 1 sets - 10 reps Prone Hip Abduction on Slider - 1 x daily - 3 x weekly - 2 sets - 10 reps Hooklying Isometric Hip Flexion - 1 x daily - 3 x weekly - 1 sets - 10 reps Hooklying Isometric Hip Flexion with Opposite Arm - 1 x daily - 7 x weekly - 1 sets - 10 reps Quadruped Hip Hike on Foam - 1 x daily - 3 x weekly - 2 sets - 10 reps Supine Bridge with Mini Swiss Ball Between Knees - 1 x daily - 7 x weekly - 1 sets - 10 reps Standing Shoulder Extension with Resistance - 1 x daily - 3 x weekly - 2 sets - 10 reps Standing Hip Hinge - 1 x daily - 3 x weekly - 1 sets - 10 reps   [facility] [address] [place], [place] [address] Phone # [phone] Fax [phone]

## 2021-09-18 NOTE — Therapy (Signed)
[place] @ [place], [place], [address] Phone: [phone]   Fax:  [phone]  Physical Therapy Treatment  Patient Details  Name: Amy Burton MRN: [id number] Date of Birth: 06/11/1961 Referring Provider (PT): Dr. Aundria Rud   Encounter Date: 09/18/2021   PT End of Session - 09/18/21 1106     Visit Number 4    Date for PT Re-Evaluation 12/30/21    Authorization Type [employer]    Authorization - Visit Number 4    Authorization - Number of Visits 30    PT Start Time 2376    PT Stop Time 1055    PT Time Calculation (min) 40 min    Activity Tolerance Patient tolerated treatment well;No increased pain    Behavior During Therapy [facility] for tasks assessed/performed             Past Medical History:  Diagnosis Date   Anxiety    Colon polyp    Depression    Endometriosis    Fibroid    Thyroid disease     Past Surgical History:  Procedure Laterality Date   ANTERIOR AND POSTERIOR REPAIR  04/15/2019   APPENDECTOMY     BREAST SURGERY     CESAREAN SECTION     ENDOMETRIAL BIOPSY  04/27/2020   benign   endomitriosis  1995   HYSTEROSCOPY WITH D & C  05/31/2020   benign   OOPHORECTOMY Right 1996   SALPINGECTOMY Bilateral 1996    There were no vitals filed for this visit.   Subjective Assessment - 09/18/21 1015     Subjective I have gone to the bathroom in the morning 5 consecutive days. The breathing techniques are working. Strain a little. I am reminding myself to breath. I am splinting less. Do not have to start a bowel movement to splint. I will splint to help the bowel movement move along. I have been able to cook and have healthy meals. I can stand for longer with back tightness. I was able to stand for an hour without the back pain stopping me. I am not getting the stomach ache. I am sitll doing the abdominal massage. I am not having the pain and pressure rectally when I stand.     Patient Stated Goals to have a normal bowel movements,    Currently in Pain? Yes    Pain Score 2    can go to 6/10   Pain Location Hip    Pain Orientation Right    Pain Descriptors / Indicators Sharp    Pain Type Acute pain    Pain Radiating Towards radiate down to ibuprofen    Pain Onset More than a month ago    Pain Frequency Intermittent    Aggravating Factors  laying down, sitting still    Pain Relieving Factors massage helps, drinks lots of water, going to the chiropractor    Multiple Pain Sites No                [facility] PT Assessment - 09/18/21 0001       Palpation   SI assessment  ASIS are equal                           [facility] Adult PT Treatment/Exercise - 09/18/21 0001       Lumbar Exercises: Supine   Ab Set 15 reps;5 seconds    Bridge with [employer]  10 reps    Isometric Hip Flexion 10 reps;5 seconds    Isometric Hip Flexion Limitations push ball into knee, knees at 90/90; then do diagonal to incorporate the obliques      Lumbar Exercises: Quadruped   Other Quadruped Lumbar Exercises quadruped hip hike with verbal cues to not side bend trunk 10 x each side      Knee/Hip Exercises: Standing   Other Standing Knee Exercises hip hinge at wall keeping spinal neutral 10x without flexing at the thoraciclumbar junction    Other Standing Knee Exercises standing bilateral shoulder extension to work on back strength for hip hinge.                     PT Education - 09/18/21 1037     Education Details Access Code: [email or web address]    Person(s) Educated Patient    Methods Explanation;Demonstration;Verbal cues;Handout    Comprehension Returned demonstration;Verbalized understanding              PT Short Term Goals - 09/18/21 1118       PT SHORT TERM GOAL #1   Title independent with initial HEP with flexibility exercises    Time 4    Period Weeks    Status Achieved      PT SHORT TERM GOAL #2   Title understand correct toileting  technique to reduce strain on the pelvic floor    Time 4    Period Weeks    Status Achieved      PT SHORT TERM GOAL #3   Title how to splint the pelvic floor to reduce rectocele while bathrooming    Time 4    Status Achieved      PT SHORT TERM GOAL #4   Title understand how to perform abdominal massage to promote improved bowel health and decrease strain on the pelvic floor    Time 4    Period Weeks    Status On-going               PT Long Term Goals - 09/01/21 2358       PT LONG TERM GOAL #1   Title independent with HEP and understand how to progress herself    Baseline --    Time 4    Period Months    Status New    Target Date 12/30/21      PT LONG TERM GOAL #2   Title able to bulge the pelvis floor correctly to push the stool out and not strain    Baseline --    Time 4    Period Months    Status New    Target Date 12/31/21      PT LONG TERM GOAL #3   Title straining tp jave a bowel movement decreased >/= 75%    Baseline --    Time 4    Period Months    Status New    Target Date 12/31/21      PT LONG TERM GOAL #4   Title bilateral hip abduction >/= 4/5 to assist abdominals to contract  to have a bowel movement correctlu    Baseline ---    Time 4    Period Months    Status New    Target Date 12/31/21                   Plan - 09/18/21 1106     Clinical Impression Statement Patient is not having the pain  in her rectum or pressure with standing. Patient was able to stand for 1 hour for first time with less pain. Patient is not having to splint with initial bowel movement. She will splint to move the stool along. Patient is learning core exercises to assist with pushing the stool out. She has difficulty with not flexing at the thoracolumbar junction when hinging at her hips. Patient has difficutly with engagement of the lower abdominals due to holding her breath and push the lower abdominals out. She pelvis is in correct alignment. Patient will  benefit from skilled therapy to improve pelvic floor coordination to improve bowel movements and reduce straining.    Personal Factors and Comorbidities Comorbidity 3+;Past/Current Experience    Comorbidities anterior and posterior repair 04/15/2019; appendectomey; c-section    Examination-Activity Limitations Toileting    Stability/Clinical Decision Making Stable/Uncomplicated    Rehab Potential Excellent    PT Frequency 1x / week    PT Duration Other (comment)   4 months   PT Treatment/Interventions ADLs/Self Care Home Management;Biofeedback;Therapeutic activities;Therapeutic exercise;Neuromuscular re-education;Patient/family education;Manual techniques;Dry needling;Taping;Joint Manipulations    PT Next Visit Plan continue to work patient core strength;see if she is able to lift her leg to press the ball in, work on hip abductors and gluteal strength; lunges with sitting on mat    PT Home Exercise Plan Access Code: [email or web address]    Consulted and Agree with Plan of Care Patient             Patient will benefit from skilled therapeutic intervention in order to improve the following deficits and impairments:  Decreased coordination, Increased fascial restricitons, Decreased strength, Decreased activity tolerance  Visit Diagnosis: Other lack of coordination  Muscle weakness (generalized)     Problem List Patient Active Problem List   Diagnosis Date Noted   Dysuria 10/22/2017   Nonallopathic lesion of thoracic region 05/15/2017   Nonallopathic lesion of cervical region 05/15/2017   Nonallopathic lesion of sacral region 05/15/2017   Chronic neck pain 04/30/2017   Chronic back pain 04/30/2017   Arthralgia of hip 04/30/2017   Pelvic prolapse 04/30/2017   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   Eczema 09/07/2016   Urinary urgency 09/07/2016   Chronic sinusitis 09/29/2015   Allergic rhinitis 09/29/2015   Prediabetes 09/29/2015   Vitamin D deficiency 09/29/2015   Situational  anxiety-flying 09/29/2015   ETD (Eustachian tube dysfunction), bilateral 09/29/2015    Earlie Counts, PT 09/18/21 11:19 AM  [facility] @ [place], [place], [address] Phone: [phone]   Fax:  [phone]  Name: Amy Burton MRN: [id number] Date of Birth: 25-Sep-1960

## 2021-09-27 ENCOUNTER — Other Ambulatory Visit: Payer: Self-pay

## 2021-09-27 ENCOUNTER — Ambulatory Visit: Payer: 59 | Attending: Obstetrics and Gynecology | Admitting: Physical Therapy

## 2021-09-27 ENCOUNTER — Encounter: Payer: Self-pay | Admitting: Physical Therapy

## 2021-09-27 DIAGNOSIS — R278 Other lack of coordination: Secondary | ICD-10-CM | POA: Diagnosis not present

## 2021-09-27 DIAGNOSIS — M6281 Muscle weakness (generalized): Secondary | ICD-10-CM | POA: Insufficient documentation

## 2021-09-27 NOTE — Therapy (Signed)
[place] @ [place], [place], [address] Phone: [phone]   Fax:  [phone]  Physical Therapy Treatment  Patient Details  Name: Amy Burton MRN: [id number] Date of Birth: 1960/11/02 Referring Provider (PT): Dr. Aundria Rud   Encounter Date: 09/27/2021   PT End of Session - 09/27/21 1118     Visit Number 5    Date for PT Re-Evaluation 12/30/21    Authorization Type [employer]    Authorization - Visit Number 5    Authorization - Number of Visits 30    PT Start Time 0930    PT Stop Time 4401    PT Time Calculation (min) 45 min    Activity Tolerance Patient tolerated treatment well;No increased pain    Behavior During Therapy [facility] for tasks assessed/performed             Past Medical History:  Diagnosis Date   Anxiety    Colon polyp    Depression    Endometriosis    Fibroid    Thyroid disease     Past Surgical History:  Procedure Laterality Date   ANTERIOR AND POSTERIOR REPAIR  04/15/2019   APPENDECTOMY     BREAST SURGERY     CESAREAN SECTION     ENDOMETRIAL BIOPSY  04/27/2020   benign   endomitriosis  1995   HYSTEROSCOPY WITH D & C  05/31/2020   benign   OOPHORECTOMY Right 1996   SALPINGECTOMY Bilateral 1996    There were no vitals filed for this visit.   Subjective Assessment - 09/27/21 0937     Subjective Last week was a set back. Everything was out of alignment. I pulled the thoracic area and was down for 3 days. I am urinating every 10 minutes. I can have a bowel movement faster.    Patient Stated Goals to have a normal bowel movements,    Currently in Pain? Yes    Pain Score 2     Pain Location Hip    Pain Orientation Left    Pain Descriptors / Indicators Sharp    Pain Type Acute pain    Pain Onset More than a month ago    Pain Frequency Intermittent    Aggravating Factors  getting up and down    Pain Relieving Factors massage helps, drinks lots of water,  going to the chiropractor                            Pelvic Floor Special Questions - 09/27/21 0001     Pelvic Floor Internal Exam Patient confirmed identification and approves PT to assess pelvic floor and treatment    Exam Type Vaginal    Strength fair squeeze, definite lift               [facility] Adult PT Treatment/Exercise - 09/27/21 0001       Manual Therapy   Manual Therapy Soft tissue mobilization;Internal Pelvic Floor    Manual therapy comments to assess for dry needling    Soft tissue mobilization manual work to bilateral quadratus, lumbar paraspinals, and gluteus medius to elongate after dry needling    Internal Pelvic Floor manual work to the obturator internis on the right and left levator ani; fascial release along the urethra and bladder to reduce urge to void              Trigger Point Dry Needling -  09/27/21 0001     Consent Given? Yes    Education Handout Provided Yes    Muscles Treated Back/Hip Lumbar multifidi;Quadratus lumborum;Gluteus medius    Gluteus Medius Response Twitch response elicited;Palpable increased muscle length    Lumbar multifidi Response Twitch response elicited;Palpable increased muscle length    Quadratus Lumborum Response Twitch response elicited;Palpable increased muscle length                   PT Education - 09/27/21 1117     Education Details information on dry needling    Person(s) Educated Patient    Methods Explanation;Handout    Comprehension Verbalized understanding              PT Short Term Goals - 09/27/21 1144       PT SHORT TERM GOAL #1   Title independent with initial HEP with flexibility exercises    Time 4    Period Weeks    Status Achieved    Target Date 09/29/21      PT SHORT TERM GOAL #2   Title understand correct toileting technique to reduce strain on the pelvic floor    Time 4    Period Weeks    Status Achieved    Target Date 09/29/21      PT SHORT TERM GOAL  #3   Title how to splint the pelvic floor to reduce rectocele while bathrooming    Time 4    Period Weeks    Status Achieved      PT SHORT TERM GOAL #4   Title understand how to perform abdominal massage to promote improved bowel health and decrease strain on the pelvic floor    Time 4    Period Weeks    Status Achieved               PT Long Term Goals - 09/01/21 2358       PT LONG TERM GOAL #1   Title independent with HEP and understand how to progress herself    Baseline --    Time 4    Period Months    Status New    Target Date 12/30/21      PT LONG TERM GOAL #2   Title able to bulge the pelvis floor correctly to push the stool out and not strain    Baseline --    Time 4    Period Months    Status New    Target Date 12/31/21      PT LONG TERM GOAL #3   Title straining tp jave a bowel movement decreased >/= 75%    Baseline --    Time 4    Period Months    Status New    Target Date 12/31/21      PT LONG TERM GOAL #4   Title bilateral hip abduction >/= 4/5 to assist abdominals to contract  to have a bowel movement correctlu    Baseline ---    Time 4    Period Months    Status New    Target Date 12/31/21                   Plan - 09/27/21 0950     Clinical Impression Statement Patient has had increased in low back and thoracic pain due to taking care of her pug. She has seen the chiropractor to adjust her spine. She has had increased in urinary urgency and going to the bathroom  every 10 minutes. After the manual work to the lumbar spine, her urgency was abolished after that. She had increased trigger points in the right lumbar paraspinals but after the dry needling ther were released. She had increased restrictions on the area of the posterior levator. Vaginal strength is 3/5.  Patient will benefit from skilled threrapy to improve pelvic floor coordination to improve bowel movements and reduce straining.    Personal Factors and Comorbidities  Comorbidity 3+;Past/Current Experience    Comorbidities anterior and posterior repair 04/15/2019; appendectomey; c-section    Examination-Activity Limitations Toileting    Stability/Clinical Decision Making Stable/Uncomplicated    Rehab Potential Excellent    PT Frequency 1x / week    PT Duration Other (comment)   4 months   PT Treatment/Interventions ADLs/Self Care Home Management;Biofeedback;Therapeutic activities;Therapeutic exercise;Neuromuscular re-education;Patient/family education;Manual techniques;Dry needling;Taping;Joint Manipulations    PT Next Visit Plan continue to work patient core strength;see if she is able to lift her leg to press the ball in, work on hip abductors and gluteal strength; lunges with sitting on mat; see if she needs more dry needling to lumbar, work on posterior left distal levator ani.    PT Home Exercise Plan Access Code: [email or web address]    Consulted and Agree with Plan of Care Patient             Patient will benefit from skilled therapeutic intervention in order to improve the following deficits and impairments:  Decreased coordination, Increased fascial restricitons, Decreased strength, Decreased activity tolerance  Visit Diagnosis: Other lack of coordination  Muscle weakness (generalized)     Problem List Patient Active Problem List   Diagnosis Date Noted   Dysuria 10/22/2017   Nonallopathic lesion of thoracic region 05/15/2017   Nonallopathic lesion of cervical region 05/15/2017   Nonallopathic lesion of sacral region 05/15/2017   Chronic neck pain 04/30/2017   Chronic back pain 04/30/2017   Arthralgia of hip 04/30/2017   Pelvic prolapse 04/30/2017   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   Eczema 09/07/2016   Urinary urgency 09/07/2016   Chronic sinusitis 09/29/2015   Allergic rhinitis 09/29/2015   Prediabetes 09/29/2015   Vitamin D deficiency 09/29/2015   Situational anxiety-flying 09/29/2015   ETD (Eustachian tube dysfunction), bilateral  09/29/2015    Earlie Counts, PT 09/27/21 11:45 AM  [place] @ [place], [place], [address] Phone: [phone]   Fax:  [phone]  Name: Adelena Desantiago MRN: [id number] Date of Birth: 04-19-1961

## 2021-09-27 NOTE — Patient Instructions (Signed)

## 2021-10-02 ENCOUNTER — Telehealth: Payer: Self-pay | Admitting: Gastroenterology

## 2021-10-02 ENCOUNTER — Ambulatory Visit: Payer: 59 | Admitting: Physical Therapy

## 2021-10-02 ENCOUNTER — Encounter: Payer: Self-pay | Admitting: Physical Therapy

## 2021-10-02 ENCOUNTER — Other Ambulatory Visit: Payer: Self-pay

## 2021-10-02 DIAGNOSIS — R278 Other lack of coordination: Secondary | ICD-10-CM

## 2021-10-02 DIAGNOSIS — M6281 Muscle weakness (generalized): Secondary | ICD-10-CM

## 2021-10-02 NOTE — Telephone Encounter (Signed)
Good afternoon Dr. Silverio Decamp, patient called stating contracted strep throat so she rescheduled her procedure from 10/05/2021 to 11/15/2021.

## 2021-10-02 NOTE — Telephone Encounter (Signed)
Ok. Thanks!

## 2021-10-02 NOTE — Therapy (Signed)
[place] @ [place], [place], [address] Phone: [phone]   Fax:  [phone]  Physical Therapy Treatment  Patient Details  Name: Amy Burton MRN: [id number] Date of Birth: [birth year] Referring Provider (PT): Dr. Aundria Rud   Encounter Date: 10/02/2021   PT End of Session - 10/02/21 0932     Visit Number 6    Date for PT Re-Evaluation 12/30/21    Authorization Type [employer]    Authorization - Visit Number 6    Authorization - Number of Visits 30    PT Start Time 0930    PT Stop Time 9509    PT Time Calculation (min) 38 min    Activity Tolerance Patient tolerated treatment well;No increased pain    Behavior During Therapy [facility] for tasks assessed/performed             Past Medical History:  Diagnosis Date   Anxiety    Colon polyp    Depression    Endometriosis    Fibroid    Thyroid disease     Past Surgical History:  Procedure Laterality Date   ANTERIOR AND POSTERIOR REPAIR  04/15/2019   APPENDECTOMY     BREAST SURGERY     CESAREAN SECTION     ENDOMETRIAL BIOPSY  04/27/2020   benign   endomitriosis  1995   HYSTEROSCOPY WITH D & C  05/31/2020   benign   OOPHORECTOMY Right 1996   SALPINGECTOMY Bilateral 1996    There were no vitals filed for this visit.   Subjective Assessment - 10/02/21 0933     Subjective I ate some raw vegetables and now my stomach is not right. Stomach and ache is not lasting as long. The dry needling helped the pelvic floor.    Patient Stated Goals to have a normal bowel movements,    Currently in Pain? No/denies    Multiple Pain Sites No                [facility] PT Assessment - 10/02/21 0001       Assessment   Medical Diagnosis n81.6 Rectocele; 39.498 Other urinary incontinence    Referring Provider (PT) Dr. Aundria Rud                        Pelvic Floor Special Questions - 10/02/21 0001     Pelvic Floor  Internal Exam Patient confirmed identification and approves PT to assess pelvic floor and treatment    Exam Type Vaginal    Strength fair squeeze, definite lift   strong contraction              [facility] Adult PT Treatment/Exercise - 10/02/21 0001       Exercises   Exercises Other Exercises    Other Exercises  pelvic floor contraction holding for 10 seconds 5 times, discussed her hip abduction exercise      Manual Therapy   Manual Therapy Internal Pelvic Floor    Internal Pelvic Floor manual work to the levator ani and puborectalis and posterior vaginal wall working on the trigger points                       PT Short Term Goals - 09/27/21 1144       PT SHORT TERM GOAL #1   Title independent with initial HEP with flexibility exercises    Time 4  Period Weeks    Status Achieved    Target Date 09/29/21      PT SHORT TERM GOAL #2   Title understand correct toileting technique to reduce strain on the pelvic floor    Time 4    Period Weeks    Status Achieved    Target Date 09/29/21      PT SHORT TERM GOAL #3   Title how to splint the pelvic floor to reduce rectocele while bathrooming    Time 4    Period Weeks    Status Achieved      PT SHORT TERM GOAL #4   Title understand how to perform abdominal massage to promote improved bowel health and decrease strain on the pelvic floor    Time 4    Period Weeks    Status Achieved               PT Long Term Goals - 10/02/21 4128       PT LONG TERM GOAL #1   Title independent with HEP and understand how to progress herself    Time 4    Period Months    Status On-going      PT LONG TERM GOAL #2   Title able to bulge the pelvis floor correctly to push the stool out and not strain    Time 4    Period Months    Status On-going    Target Date 12/31/21      PT LONG TERM GOAL #3   Title straining to have a bowel movement decreased >/= 75%    Baseline 50% better    Time 4    Period Months    Status  On-going    Target Date 12/31/21      PT LONG TERM GOAL #4   Title bilateral hip abduction >/= 4/5 to assist abdominals to contract  to have a bowel movement correctlu    Time 4    Period Months    Status On-going    Target Date 12/31/21                   Plan - 10/02/21 0942     Clinical Impression Statement Pelvic floor contraction is 3/5. Trigger points located in the pelvic floor were released with manual work. She stopped having the achiness after the manual work. Patient still has the pressure feeling in the pelvis but has decreased with her doing the pelvic floor exercises. She is not able to do the prone hip abduction due to her feeling dizzy. Patient is straining and splinting 50% less with a bowel movement. Her breathing is helping with bowel movements. Patient will benefit from skilled therapy to improve pelvic floor coordination to improve bowel movements and reduce straining.    Personal Factors and Comorbidities Comorbidity 3+;Past/Current Experience    Comorbidities anterior and posterior repair 04/15/2019; appendectomey; c-section    Examination-Activity Limitations Toileting    Rehab Potential Excellent    PT Frequency 1x / week    PT Duration Other (comment)   4 months   PT Treatment/Interventions ADLs/Self Care Home Management;Biofeedback;Therapeutic activities;Therapeutic exercise;Neuromuscular re-education;Patient/family education;Manual techniques;Dry needling;Taping;Joint Manipulations    PT Next Visit Plan continue to work patient core strength;see if she is able to lift her leg to press the ball in, work on hip abductors and gluteal strength in standing due to laying down is hard with dizziness, ; lunges with sitting on mat; see if she needs more dry needling  to lumbar    PT Home Exercise Plan Access Code: [email or web address]    Consulted and Agree with Plan of Care Patient             Patient will benefit from skilled therapeutic intervention in order to improve  the following deficits and impairments:  Decreased coordination, Increased fascial restricitons, Decreased strength, Decreased activity tolerance  Visit Diagnosis: Other lack of coordination  Muscle weakness (generalized)     Problem List Patient Active Problem List   Diagnosis Date Noted   Dysuria 10/22/2017   Nonallopathic lesion of thoracic region 05/15/2017   Nonallopathic lesion of cervical region 05/15/2017   Nonallopathic lesion of sacral region 05/15/2017   Chronic neck pain 04/30/2017   Chronic back pain 04/30/2017   Arthralgia of hip 04/30/2017   Pelvic prolapse 04/30/2017   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   Eczema 09/07/2016   Urinary urgency 09/07/2016   Chronic sinusitis 09/29/2015   Allergic rhinitis 09/29/2015   Prediabetes 09/29/2015   Vitamin D deficiency 09/29/2015   Situational anxiety-flying 09/29/2015   ETD (Eustachian tube dysfunction), bilateral 09/29/2015    Earlie Counts, PT 10/02/21 10:11 AM  [place] @ [place], [place], [address] Phone: [phone]   Fax:  [phone]  Name: Amy Burton MRN: [id number] Date of Birth: [birth year]

## 2021-10-05 ENCOUNTER — Encounter: Payer: 59 | Admitting: Gastroenterology

## 2021-10-09 ENCOUNTER — Encounter: Payer: Self-pay | Admitting: Physical Therapy

## 2021-10-09 ENCOUNTER — Ambulatory Visit: Payer: 59 | Admitting: Physical Therapy

## 2021-10-09 ENCOUNTER — Other Ambulatory Visit: Payer: Self-pay

## 2021-10-09 DIAGNOSIS — R278 Other lack of coordination: Secondary | ICD-10-CM

## 2021-10-09 DIAGNOSIS — M6281 Muscle weakness (generalized): Secondary | ICD-10-CM

## 2021-10-09 NOTE — Therapy (Signed)
[place] @ [place], [place], [address] Phone: [phone]   Fax:  [phone]  Physical Therapy Treatment  Patient Details  Name: Amy Burton MRN: [id number] Date of Birth: [birth year] Referring Provider (PT): Dr. Aundria Rud   Encounter Date: 10/09/2021   PT End of Session - 10/09/21 0933     Visit Number 7    Date for PT Re-Evaluation 12/30/21    Authorization Type [employer]    Authorization - Visit Number 7    Authorization - Number of Visits 30    PT Start Time 0930    PT Stop Time 6629    PT Time Calculation (min) 38 min    Activity Tolerance Patient tolerated treatment well;No increased pain    Behavior During Therapy [facility] for tasks assessed/performed             Past Medical History:  Diagnosis Date   Anxiety    Colon polyp    Depression    Endometriosis    Fibroid    Thyroid disease     Past Surgical History:  Procedure Laterality Date   ANTERIOR AND POSTERIOR REPAIR  04/15/2019   APPENDECTOMY     BREAST SURGERY     CESAREAN SECTION     ENDOMETRIAL BIOPSY  04/27/2020   benign   endomitriosis  1995   HYSTEROSCOPY WITH D & C  05/31/2020   benign   OOPHORECTOMY Right 1996   SALPINGECTOMY Bilateral 1996    There were no vitals filed for this visit.   Subjective Assessment - 10/09/21 0933     Subjective I am feeling pretty good. I the pressure feeling of the pelvic floor is better. I started walking again. I have been eating more items. the stool comes faster now.    Patient Stated Goals to have a normal bowel movements,    Currently in Pain? No/denies    Multiple Pain Sites No                            Pelvic Floor Special Questions - 10/09/21 0001     Pelvic Floor Internal Exam Patient confirmed identification and approves PT to assess pelvic floor and treatment    Exam Type Vaginal    Strength fair squeeze, definite lift                [facility] Adult PT Treatment/Exercise - 10/09/21 0001       Manual Therapy   Manual Therapy Internal Pelvic Floor    Internal Pelvic Floor to bilateral levator ani, obturator internist, side fo the bladder, while moving her legs and monitoring  for releases                       PT Short Term Goals - 09/27/21 1144       PT SHORT TERM GOAL #1   Title independent with initial HEP with flexibility exercises    Time 4    Period Weeks    Status Achieved    Target Date 09/29/21      PT SHORT TERM GOAL #2   Title understand correct toileting technique to reduce strain on the pelvic floor    Time 4    Period Weeks    Status Achieved    Target Date 09/29/21      PT SHORT TERM GOAL #3   Title how to splint  the pelvic floor to reduce rectocele while bathrooming    Time 4    Period Weeks    Status Achieved      PT SHORT TERM GOAL #4   Title understand how to perform abdominal massage to promote improved bowel health and decrease strain on the pelvic floor    Time 4    Period Weeks    Status Achieved               PT Long Term Goals - 10/02/21 6283       PT LONG TERM GOAL #1   Title independent with HEP and understand how to progress herself    Time 4    Period Months    Status On-going      PT LONG TERM GOAL #2   Title able to bulge the pelvis floor correctly to push the stool out and not strain    Time 4    Period Months    Status On-going    Target Date 12/31/21      PT LONG TERM GOAL #3   Title straining to have a bowel movement decreased >/= 75%    Baseline 50% better    Time 4    Period Months    Status On-going    Target Date 12/31/21      PT LONG TERM GOAL #4   Title bilateral hip abduction >/= 4/5 to assist abdominals to contract  to have a bowel movement correctlu    Time 4    Period Months    Status On-going    Target Date 12/31/21                   Plan - 10/09/21 1008     Clinical Impression Statement Patient is  doing better. She does not feel as much pressure in the pelvis. Patient pelvic floor strength is 3/5 and almost 4/5. Patient had trigger points in the levator ani and obturaotr  internist but the tissue was softer. She reports her stool comes out faster so she is not on the commode as much. Patient will benefit from skilled therapy to improve pelvic floor coordinaiton to improve bowel movments and reduce straining.    Personal Factors and Comorbidities Comorbidity 3+;Past/Current Experience    Comorbidities anterior and posterior repair 04/15/2019; appendectomey; c-section    Examination-Activity Limitations Toileting    Stability/Clinical Decision Making Stable/Uncomplicated    Rehab Potential Excellent    PT Frequency 1x / week    PT Duration Other (comment)   4 months   PT Treatment/Interventions ADLs/Self Care Home Management;Biofeedback;Therapeutic activities;Therapeutic exercise;Neuromuscular re-education;Patient/family education;Manual techniques;Dry needling;Taping;Joint Manipulations    PT Next Visit Plan continue to work patient core strength;see if she is able to lift her leg to press the ball in, work on hip abductors and gluteal strength in standing due to laying down is hard with dizziness, ; lunges with sitting on mat;    PT Home Exercise Plan Access Code: [email or web address]    Consulted and Agree with Plan of Care Patient             Patient will benefit from skilled therapeutic intervention in order to improve the following deficits and impairments:  Decreased coordination, Increased fascial restricitons, Decreased strength, Decreased activity tolerance  Visit Diagnosis: Other lack of coordination  Muscle weakness (generalized)     Problem List Patient Active Problem List   Diagnosis Date Noted   Dysuria 10/22/2017   Nonallopathic lesion of thoracic  region 05/15/2017   Nonallopathic lesion of cervical region 05/15/2017   Nonallopathic lesion of sacral region 05/15/2017    Chronic neck pain 04/30/2017   Chronic back pain 04/30/2017   Arthralgia of hip 04/30/2017   Pelvic prolapse 04/30/2017   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   Eczema 09/07/2016   Urinary urgency 09/07/2016   Chronic sinusitis 09/29/2015   Allergic rhinitis 09/29/2015   Prediabetes 09/29/2015   Vitamin D deficiency 09/29/2015   Situational anxiety-flying 09/29/2015   ETD (Eustachian tube dysfunction), bilateral 09/29/2015    Earlie Counts, PT 10/09/21 10:14 AM  [place] @ [place], [place], [address] Phone: [phone]   Fax:  [phone]  Name: Anjanette Gilkey MRN: [id number] Date of Birth: 1960/11/10

## 2021-10-23 ENCOUNTER — Encounter: Payer: Self-pay | Admitting: Physical Therapy

## 2021-10-23 ENCOUNTER — Other Ambulatory Visit: Payer: Self-pay

## 2021-10-23 ENCOUNTER — Ambulatory Visit: Payer: 59 | Admitting: Physical Therapy

## 2021-10-23 DIAGNOSIS — R278 Other lack of coordination: Secondary | ICD-10-CM

## 2021-10-23 DIAGNOSIS — M6281 Muscle weakness (generalized): Secondary | ICD-10-CM

## 2021-10-23 NOTE — Patient Instructions (Addendum)
Moisturizers They are used in the vagina to hydrate the mucous membrane that make up the vaginal canal. Designed to keep a more normal acid balance (ph) Once placed in the vagina, it will last between two to three days.  Use 2-3 times per week at bedtime  Ingredients to avoid is glycerin and fragrance, can increase chance of infection Should not be used just before sex due to causing irritation Most are gels administered either in a tampon-shaped applicator or as a vaginal suppository. They are non-hormonal.   Types of Moisturizers(internal use)  Vitamin E vaginal suppositories- Whole foods, Amazon Moist Again Coconut oil- can break down condoms Julva- (Do no use if on Tamoxifen) amazon Yes moisturizer- amazon NeuEve Silk , NeuEve Silver for menopausal or over 65 (if have severe vaginal atrophy or cancer treatments use NeuEve Silk for  1 month than move to [employer])- [employer], Elbert.com Olive and Bee intimate cream- www.oliveandbee.com.au Mae vaginal moisturizer- Amazon Aloe    Creams to use externally on the Vulva area Albertson's (good for for cancer patients that had radiation to the area)- [place] or [employer].FlyingBasics.com.br V-magic cream - amazon Julva-amazon Vital "V Wild Yam salve ( help moisturize and help with thinning vulvar area, does have [place] by Irwin Brakeman labial moisturizer (Amazon,  Coconut or olive oil aloe   Things to avoid in the vaginal area Do not use things to irritate the vulvar area No lotions just specialized creams for the vulva area- Neogyn, V-magic, No soaps; can use Aveeno or Calendula cleanser if needed. Must be gentle No deodorants No douches Good to sleep without underwear to let the vaginal area to air out No scrubbing: spread the lips to let warm water rinse over labias and pat dry  Access Code: [email or web address] URL: [email or web address] Date:  10/23/2021 Prepared by: Earlie Counts  Exercises Supine Hamstring Stretch - 1 x daily - 3 x weekly - 1 sets - 2 reps - 30 sec hold Supine Piriformis Stretch with Leg Straight - 1 x daily - 3 x weekly - 2 sets - 2 reps - 30 sec hold V Sit Hip Adductor Hamstring Stretch - 1 x daily - 3 x weekly - 1 sets - 2 reps - 30 sec hold Standing Quadriceps Stretch - 1 x daily - 3 x weekly - 2 sets - 2 reps - 30 sec hold Supine Transversus Abdominis Bracing - Hands on Stomach - 1 x daily - 7 x weekly - 1 sets - 10 reps Prone Hip Abduction on Slider - 1 x daily - 3 x weekly - 2 sets - 10 reps Hooklying Isometric Hip Flexion - 1 x daily - 3 x weekly - 1 sets - 10 reps Hooklying Isometric Hip Flexion with Opposite Arm - 1 x daily - 7 x weekly - 1 sets - 10 reps Quadruped Hip Hike on Foam - 1 x daily - 3 x weekly - 2 sets - 10 reps Supine Bridge with Mini Swiss Ball Between Knees - 1 x daily - 7 x weekly - 1 sets - 10 reps Standing Shoulder Extension with Resistance - 1 x daily - 3 x weekly - 2 sets - 10 reps Standing Hip Hinge - 1 x daily - 3 x weekly - 1 sets - 10 reps Supine Dead Bug with Leg Extension - 1 x daily - 3 x weekly - 1 sets - 10 reps Marching Bridge - 1 x daily -  3 x weekly - 1 sets - 10 reps [facility] [address], [place], [place] [address] Phone # [phone] Fax [phone]

## 2021-10-23 NOTE — Therapy (Signed)
[facility] [place] @ [place], [place], [address] Phone: [phone]   Fax:  [phone]  Physical Therapy Treatment  Patient Details  Name: Amy Burton MRN: [id number] Date of Birth: 06-15-1961 Referring Provider (PT): Dr. Aundria Rud   Encounter Date: 10/23/2021   PT End of Session - 10/23/21 1221     Visit Number 8    Date for PT Re-Evaluation 12/30/21    Authorization Type Cigna    Authorization - Visit Number 8    Authorization - Number of Visits 30    PT Start Time 2993    PT Stop Time 1225    PT Time Calculation (min) 40 min    Activity Tolerance Patient tolerated treatment well;No increased pain    Behavior During Therapy [facility] for tasks assessed/performed             Past Medical History:  Diagnosis Date   Anxiety    Colon polyp    Depression    Endometriosis    Fibroid    Thyroid disease     Past Surgical History:  Procedure Laterality Date   ANTERIOR AND POSTERIOR REPAIR  04/15/2019   APPENDECTOMY     BREAST SURGERY     CESAREAN SECTION     ENDOMETRIAL BIOPSY  04/27/2020   benign   endomitriosis  1995   HYSTEROSCOPY WITH D & C  05/31/2020   benign   OOPHORECTOMY Right 1996   SALPINGECTOMY Bilateral 1996    There were no vitals filed for this visit.   Subjective Assessment - 10/23/21 1148     Subjective I am feeling good. I feel like I am getting stronger because I am doing my exercises. My hip is better. I am gardening. I have not been drinking enough water. Knowing how to toilet has helped. I am constipated. I had intercourse and pain afterwards.    Patient Stated Goals to have a normal bowel movements,    Currently in Pain? No/denies                               [facility] Adult PT Treatment/Exercise - 10/23/21 0001       Self-Care   Self-Care Other Self-Care Comments    Other Self-Care Comments  discussed with patient on her pelvic floor  may not of relaxed after intercourse or she may need to use vaginal moisturizers for the tissue health. Use ice pack or cool wash cloth after intercourse. laying on back to reduce swelling and massage the pelvic floor due to pain.      Lumbar Exercises: Supine   Dead Bug 10 reps;1 second    Dead Bug Limitations opposite hand to knee with SLR    Bridge with March 10 reps      Manual Therapy   Manual Therapy Soft tissue mobilization;Myofascial release    Manual therapy comments educated patient on abdominal massage    Soft tissue mobilization circular massage to promote peristalic motion, manual work to bilateral diaphgram    Internal Pelvic Floor faslcial work to the lower abdomen, suction cup to the scar to imporve tissue mobiity, manual work ot the mesenteric root                     PT Education - 10/23/21 1226     Education Details Access Code: [email or web address] ; vaginal moisturizers    Person(s) Educated  Patient    Methods Explanation;Demonstration;Verbal cues;Handout    Comprehension Returned demonstration;Verbalized understanding              PT Short Term Goals - 09/27/21 1144       PT SHORT TERM GOAL #1   Title independent with initial HEP with flexibility exercises    Time 4    Period Weeks    Status Achieved    Target Date 09/29/21      PT SHORT TERM GOAL #2   Title understand correct toileting technique to reduce strain on the pelvic floor    Time 4    Period Weeks    Status Achieved    Target Date 09/29/21      PT SHORT TERM GOAL #3   Title how to splint the pelvic floor to reduce rectocele while bathrooming    Time 4    Period Weeks    Status Achieved      PT SHORT TERM GOAL #4   Title understand how to perform abdominal massage to promote improved bowel health and decrease strain on the pelvic floor    Time 4    Period Weeks    Status Achieved               PT Long Term Goals - 10/23/21 1233       PT LONG TERM GOAL #1   Title  independent with HEP and understand how to progress herself    Time 4    Period Months    Status On-going      PT LONG TERM GOAL #2   Title able to bulge the pelvis floor correctly to push the stool out and not strain    Time 4    Period Months    Status On-going      PT LONG TERM GOAL #3   Title straining to have a bowel movement decreased >/= 75%    Baseline 50% better    Time 4    Period Months    Status On-going      PT LONG TERM GOAL #4   Title bilateral hip abduction >/= 4/5 to assist abdominals to contract  to have a bowel movement correctlu    Time 4    Period Months    Status On-going                   Plan - 10/23/21 1230     Clinical Impression Statement Patient was constipated today and needed abdominal massage. She was educated on how to perform at home. She is ready for advanced HEP and was able to perform correctly. Patient does better with bowel movements when she drinks enough water and is sitting correctly with correct breath. Patient will benefit from skilled therapy to improve pelvic floor coordination to improve bowel movements and reduce straining.    Personal Factors and Comorbidities Comorbidity 3+;Past/Current Experience    Comorbidities anterior and posterior repair 04/15/2019; appendectomey; c-section    Examination-Activity Limitations Toileting    Stability/Clinical Decision Making Stable/Uncomplicated    Rehab Potential Excellent    PT Frequency 1x / week    PT Duration Other (comment)   4 months   PT Treatment/Interventions ADLs/Self Care Home Management;Biofeedback;Therapeutic activities;Therapeutic exercise;Neuromuscular re-education;Patient/family education;Manual techniques;Dry needling;Taping;Joint Manipulations    PT Next Visit Plan updated HEP and possible discharge.    PT Home Exercise Plan Access Code: [email or web address]    Consulted and Agree with Plan of Care Patient  Patient will benefit from skilled therapeutic  intervention in order to improve the following deficits and impairments:  Decreased coordination, Increased fascial restricitons, Decreased strength, Decreased activity tolerance  Visit Diagnosis: Other lack of coordination  Muscle weakness (generalized)     Problem List Patient Active Problem List   Diagnosis Date Noted   Dysuria 10/22/2017   Nonallopathic lesion of thoracic region 05/15/2017   Nonallopathic lesion of cervical region 05/15/2017   Nonallopathic lesion of sacral region 05/15/2017   Chronic neck pain 04/30/2017   Chronic back pain 04/30/2017   Arthralgia of hip 04/30/2017   Pelvic prolapse 04/30/2017   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   Eczema 09/07/2016   Urinary urgency 09/07/2016   Chronic sinusitis 09/29/2015   Allergic rhinitis 09/29/2015   Prediabetes 09/29/2015   Vitamin D deficiency 09/29/2015   Situational anxiety-flying 09/29/2015   ETD (Eustachian tube dysfunction), bilateral 09/29/2015    Earlie Counts, PT 10/23/21 12:34 PM  [facility] @ [place], [place], [address] Phone: [phone]   Fax:  [phone]  Name: Eknoor Novack MRN: [id number] Date of Birth: 1961/07/24

## 2021-10-27 ENCOUNTER — Ambulatory Visit: Payer: 59 | Admitting: Gastroenterology

## 2021-11-07 ENCOUNTER — Encounter: Payer: Self-pay | Admitting: Certified Registered Nurse Anesthetist

## 2021-11-08 ENCOUNTER — Ambulatory Visit: Payer: 59 | Attending: Obstetrics and Gynecology | Admitting: Physical Therapy

## 2021-11-08 ENCOUNTER — Other Ambulatory Visit: Payer: Self-pay

## 2021-11-08 ENCOUNTER — Encounter: Payer: Self-pay | Admitting: Physical Therapy

## 2021-11-08 DIAGNOSIS — R278 Other lack of coordination: Secondary | ICD-10-CM | POA: Insufficient documentation

## 2021-11-08 DIAGNOSIS — M6281 Muscle weakness (generalized): Secondary | ICD-10-CM | POA: Diagnosis present

## 2021-11-08 NOTE — Therapy (Signed)
[place] ?[facility] @ [place] ?[place], [place], [address] ?Phone: [phone]   Fax:  [phone] ? ?Physical Therapy Treatment ? ?Patient Details  ?Name: Amy Burton ?MRN: [id number] ?Date of Birth: [birth year] ?Referring Provider (PT): Dr. Aundria Rud ? ? ?Encounter Date: 11/08/2021 ? ? PT End of Session - 11/08/21 0931   ? ? Visit Number 9   ? Date for PT Re-Evaluation 12/30/21   ? Authorization Type Cigna   ? Authorization - Visit Number 9   ? Authorization - Number of Visits 30   ? PT Start Time 0930   ? PT Stop Time 1010   ? PT Time Calculation (min) 40 min   ? Activity Tolerance Patient tolerated treatment well;No increased pain   ? Behavior During Therapy [facility] for tasks assessed/performed   ? ?  ?  ? ?  ? ? ?Past Medical History:  ?Diagnosis Date  ? Anxiety   ? Colon polyp   ? Depression   ? Endometriosis   ? Fibroid   ? Thyroid disease   ? ? ?Past Surgical History:  ?Procedure Laterality Date  ? ANTERIOR AND POSTERIOR REPAIR  04/15/2019  ? APPENDECTOMY    ? BREAST SURGERY    ? CESAREAN SECTION    ? ENDOMETRIAL BIOPSY  04/27/2020  ? benign  ? endomitriosis  1995  ? HYSTEROSCOPY WITH D & C  05/31/2020  ? benign  ? OOPHORECTOMY Right 1996  ? SALPINGECTOMY Bilateral 1996  ? ? ?There were no vitals filed for this visit. ? ? Subjective Assessment - 11/08/21 0932   ? ? Subjective Exercises are going good. I have trouble with single leg stretch on the right. I have had more sharp pains and soreness. I am going to the bathroom better but still have times that have to push hard.   ? Patient Stated Goals to have a normal bowel movements,   ? Currently in Pain? Yes   ? Pain Score 4    ? Pain Location Pelvis   ? Pain Orientation Right;Left   ? Pain Descriptors / Indicators Sharp   ? Pain Type Acute pain   ? Pain Onset More than a month ago   ? Pain Frequency Intermittent   ? Aggravating Factors  not taking the time to put feet up   ? Pain Relieving  Factors being on her feet more   ? Multiple Pain Sites No   ? ?  ?  ? ?  ? ? ? ? ? ? ? ? ? ? ? ? ? ? ? ? ? Pelvic Floor Special Questions - 11/08/21 0001   ? ? Pelvic Floor Internal Exam Patient confirmed identification and approves PT to assess pelvic floor and treatment   ? Exam Type Vaginal   ? ?  ?  ? ?  ? ? ? ? [facility] Adult PT Treatment/Exercise - 11/08/21 0001   ? ?  ? Self-Care  ? Self-Care Other Self-Care Comments   ? Other Self-Care Comments  educated patient on support garments for prolapse and pelvic discomfort. ; being on her feet then lay down to reset the pelvic area; educated patient on vaginal moisturizers andhow they help the vaginal canal and tissue; discussed with patient getting a different vaginal wand to work on the muscles and not aggravate her neck   ?  ? Manual Therapy  ? Manual Therapy Internal Pelvic Floor   ? Internal Pelvic Floor manual work to  the obturator internist and levator ani with hip movemements and monitoring for pain   ? ?  ?  ? ?  ? ? ? ? ? ? ? ? ? ? PT Education - 11/08/21 1008   ? ? Education Details education on vaginal moisturizers, vaginal wand, vaginal moisturizers, and support garments   ? Person(s) Educated Patient   ? Methods Explanation;Handout   ? Comprehension Verbalized understanding   ? ?  ?  ? ?  ? ? ? PT Short Term Goals - 09/27/21 1144   ? ?  ? PT SHORT TERM GOAL #1  ? Title independent with initial HEP with flexibility exercises   ? Time 4   ? Period Weeks   ? Status Achieved   ? Target Date 09/29/21   ?  ? PT SHORT TERM GOAL #2  ? Title understand correct toileting technique to reduce strain on the pelvic floor   ? Time 4   ? Period Weeks   ? Status Achieved   ? Target Date 09/29/21   ?  ? PT SHORT TERM GOAL #3  ? Title how to splint the pelvic floor to reduce rectocele while bathrooming   ? Time 4   ? Period Weeks   ? Status Achieved   ?  ? PT SHORT TERM GOAL #4  ? Title understand how to perform abdominal massage to promote improved bowel health and  decrease strain on the pelvic floor   ? Time 4   ? Period Weeks   ? Status Achieved   ? ?  ?  ? ?  ? ? ? ? PT Long Term Goals - 11/08/21 1013   ? ?  ? PT LONG TERM GOAL #1  ? Title independent with HEP and understand how to progress herself   ? Time 4   ? Period Months   ? Status On-going   ? Target Date 12/30/21   ?  ? PT LONG TERM GOAL #2  ? Title able to bulge the pelvis floor correctly to push the stool out and not strain   ? Time 4   ? Period Months   ? Status On-going   ? Target Date 12/31/21   ?  ? PT LONG TERM GOAL #3  ? Title straining to have a bowel movement decreased >/= 75%   ? Baseline 50% better   ? Time 4   ? Period Months   ? Status On-going   ?  ? PT LONG TERM GOAL #4  ? Title bilateral hip abduction >/= 4/5 to assist abdominals to contract  to have a bowel movement correctlu   ? Time 4   ? Period Months   ? Status On-going   ? Target Date 12/31/21   ? ?  ?  ? ?  ? ? ? ? ? ? ? ? Plan - 11/08/21 1009   ? ? Clinical Impression Statement Patient was educated on garments to support her pelvic floor and reduce pain. She was educated on ways to work with vaginal dryness. Patient had trigger points in her levator ani and obturator internist that could contribute to her pelvic pain. She was educated on a different vagial wand to use that will not aggravate her neck. Patient is doing her HEP daily. She is laying down to reset the pelvic floor. Patient still strains to have a bowel movement but not as often. Patient will benefit from skilled therapy to improve pelvic floor coordinaiton to improve bowel  movements and reduce straining.   ? Personal Factors and Comorbidities Comorbidity 3+;Past/Current Experience   ? Comorbidities anterior and posterior repair 04/15/2019; appendectomey; c-section   ? Examination-Activity Limitations Toileting   ? Stability/Clinical Decision Making Stable/Uncomplicated   ? Rehab Potential Excellent   ? PT Frequency 1x / week   ? PT Duration Other (comment)   4 months  ? PT  Treatment/Interventions ADLs/Self Care Home Management;Biofeedback;Therapeutic activities;Therapeutic exercise;Neuromuscular re-education;Patient/family education;Manual techniques;Dry needling;Taping;Joint Manipulations   ? PT Next Visit Plan updated HEP and possible discharge; educate on how to use vaginal wand, internal work   ? PT Home Exercise Plan Access Code: [email or web address]   ? Consulted and Agree with Plan of Care Patient   ? ?  ?  ? ?  ? ? ?Patient will benefit from skilled therapeutic intervention in order to improve the following deficits and impairments:  Decreased coordination, Increased fascial restricitons, Decreased strength, Decreased activity tolerance ? ?Visit Diagnosis: ?Other lack of coordination ? ?Muscle weakness (generalized) ? ? ? ? ?Problem List ?Patient Active Problem List  ? Diagnosis Date Noted  ? Dysuria 10/22/2017  ? Nonallopathic lesion of thoracic region 05/15/2017  ? Nonallopathic lesion of cervical region 05/15/2017  ? Nonallopathic lesion of sacral region 05/15/2017  ? Chronic neck pain 04/30/2017  ? Chronic back pain 04/30/2017  ? Arthralgia of hip 04/30/2017  ? Pelvic prolapse 04/30/2017  ? 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  ? Eczema 09/07/2016  ? Urinary urgency 09/07/2016  ? Chronic sinusitis 09/29/2015  ? Allergic rhinitis 09/29/2015  ? Prediabetes 09/29/2015  ? Vitamin D deficiency 09/29/2015  ? Situational anxiety-flying 09/29/2015  ? ETD (Eustachian tube dysfunction), bilateral 09/29/2015  ? ? ?Earlie Counts, PT ?11/08/21 10:14 AM ? ?[facility] ?[place] @ [place] ?[place], [place], [address] ?Phone: [phone]   Fax:  [phone] ? ?Name: Amy Burton ?MRN: [id number] ?Date of Birth: 10-Jan-1961 ? ? ? ?

## 2021-11-08 NOTE — Patient Instructions (Signed)
Moisturizers ?They are used in the vagina to hydrate the mucous membrane that make up the vaginal canal. ?Designed to keep a more normal acid balance (ph) ?Once placed in the vagina, it will last between two to three days.  ?Use 2-3 times per week at bedtime  ?Ingredients to avoid is glycerin and fragrance, can increase chance of infection ?Should not be used just before sex due to causing irritation ?Most are gels administered either in a tampon-shaped applicator or as a vaginal suppository. They are non-hormonal. ? ? ?Types of Moisturizers(internal use) ? ?Vitamin E vaginal suppositories- Whole foods, Amazon ?Moist Again ?Coconut oil- can break down condoms ?Julva- (Do no use if on Tamoxifen) amazon ?Yes moisturizer- amazon ?Olive and Bee intimate cream- www.oliveandbee.com.au ?Mae vaginal moisturizer- Amazon ?Aloe ? ? ? ?Creams to use externally on the Vulva area ?V-magic cream - amazon ?Julva-amazon ?Vital "V Wild Yam salve ( help moisturize and help with thinning vulvar area, does have Beeswax ?Cloud ?[place]ert [employer] ?Cleo by Damiva labial moisturizer ([place],  ?Coconut or olive oil ?Aloe ?Enchanted rose ?Good clean love restore or balance ? ? ?Things to avoid in the vaginal area ?Do not use things to irritate the vulvar area ?No lotions just specialized creams for the vulva area- Neogyn, V-magic, No soaps; can use Aveeno or Calendula cleanser if needed. Must be gentle ?No deodorants ?No douches ?Good to sleep without underwear to let the vaginal area to air out ?No scrubbing: spread the lips to let warm water rinse over labias and pat dry ?[facility] ?[place], [address] ?[place], [place] [address] ?Phone # [phone] ?Fax [phone] ? ?

## 2021-11-14 ENCOUNTER — Encounter: Payer: Self-pay | Admitting: Certified Registered Nurse Anesthetist

## 2021-11-15 ENCOUNTER — Ambulatory Visit (AMBULATORY_SURGERY_CENTER): Payer: 59 | Admitting: Gastroenterology

## 2021-11-15 ENCOUNTER — Encounter: Payer: Self-pay | Admitting: Gastroenterology

## 2021-11-15 VITALS — BP 109/73 | HR 70 | Temp 97.1°F | Resp 12 | Ht 65.0 in | Wt 169.0 lb

## 2021-11-15 DIAGNOSIS — Z86010 Personal history of colonic polyps: Secondary | ICD-10-CM | POA: Diagnosis present

## 2021-11-15 DIAGNOSIS — D122 Benign neoplasm of ascending colon: Secondary | ICD-10-CM

## 2021-11-15 DIAGNOSIS — D120 Benign neoplasm of cecum: Secondary | ICD-10-CM

## 2021-11-15 DIAGNOSIS — K635 Polyp of colon: Secondary | ICD-10-CM | POA: Diagnosis not present

## 2021-11-15 LAB — HM COLONOSCOPY

## 2021-11-15 MED ORDER — SODIUM CHLORIDE 0.9 % IV SOLN: 500.0000 mL | Freq: Once | INTRAVENOUS | Status: DC

## 2021-11-15 NOTE — Progress Notes (Signed)
Called to room to assist during endoscopic procedure.  Patient ID and intended procedure confirmed with present staff. Received instructions for my participation in the procedure from the performing physician.  

## 2021-11-15 NOTE — Progress Notes (Signed)
VS-DT 

## 2021-11-15 NOTE — Progress Notes (Signed)
Report given to PACU, vss 

## 2021-11-15 NOTE — Op Note (Signed)
[facility] ?Patient Name: Amy Burton ?Procedure Date: 11/15/2021 10:17 AM ?MRN: [id number] ?Endoscopist: Mauri Pole , MD ?Age: 61 ?Referring MD:  ?Date of Birth: 1961-05-23 ?Gender: Female ?Account #: [id number] ?Procedure:                Colonoscopy ?Indications:              High risk colon cancer surveillance: Personal  ?                          history of colonic polyps, Last colonoscopy: 2017,  ?                          Last colonoscopy: date unknown (unable to locate  ?                          last colonoscopy report) ?Medicines:                Monitored Anesthesia Care ?Procedure:                Pre-Anesthesia Assessment: ?                          - Prior to the procedure, a History and Physical  ?                          was performed, and patient medications and  ?                          allergies were reviewed. The patient's tolerance of  ?                          previous anesthesia was also reviewed. The risks  ?                          and benefits of the procedure and the sedation  ?                          options and risks were discussed with the patient.  ?                          All questions were answered, and informed consent  ?                          was obtained. Prior Anticoagulants: The patient has  ?                          taken no previous anticoagulant or antiplatelet  ?                          agents. ASA Grade Assessment: II - A patient with  ?                          mild systemic disease. After reviewing the risks  ?  and benefits, the patient was deemed in  ?                          satisfactory condition to undergo the procedure. ?                          After obtaining informed consent, the colonoscope  ?                          was passed under direct vision. Throughout the  ?                          procedure, the patient's blood pressure, pulse, and  ?                          oxygen saturations were  monitored continuously. The  ?                          Olympus PCF-H190DL (#[id number]) Colonoscope was  ?                          introduced through the anus and advanced to the the  ?                          cecum, identified by appendiceal orifice and  ?                          ileocecal valve. The colonoscopy was performed  ?                          without difficulty. The patient tolerated the  ?                          procedure well. The quality of the bowel  ?                          preparation was excellent. The ileocecal valve,  ?                          appendiceal orifice, and rectum were photographed. ?Scope In: 10:27:13 AM ?Scope Out: 10:57:19 AM ?Scope Withdrawal Time: 0 hours 25 minutes 45 seconds  ?Total Procedure Duration: 0 hours 30 minutes 6 seconds  ?Findings:                 The perianal and digital rectal examinations were  ?                          normal. ?                          A 2 mm polyp was found in the appendiceal orifice.  ?                          The polyp was sessile. The polyp was removed with a  ?  cold snare. Resection and retrieval were complete. ?                          A 24 mm polyp was found in the ascending colon. The  ?                          polyp was sessile. The polyp was removed with a  ?                          piecemeal technique using a cold snare. Resection  ?                          and retrieval were complete. To close a defect  ?                          after polypectomy, four hemostatic clips were  ?                          successfully placed (MR conditional). There was no  ?                          bleeding at the end of the procedure. ?                          Non-bleeding external and internal hemorrhoids were  ?                          found during retroflexion. The hemorrhoids were  ?                          medium-sized. ?                          The exam was otherwise without abnormality. ?Complications:             No immediate complications. ?Estimated Blood Loss:     Estimated blood loss was minimal. ?Impression:               - One 2 mm polyp at the appendiceal orifice,  ?                          removed with a cold snare. Resected and retrieved. ?                          - One 24 mm polyp in the ascending colon, removed  ?                          piecemeal using a cold snare. Resected and  ?                          retrieved. Clips (MR conditional) were placed. ?                          - Non-bleeding external and internal hemorrhoids. ?                          -  The examination was otherwise normal. ?Recommendation:           - Resume previous diet. ?                          - Continue present medications. ?                          - Await pathology results. ?                          - Repeat colonoscopy in 1 year for surveillance  ?                          after piecemeal polypectomy. ?                          - No aspirin, ibuprofen, naproxen, or other  ?                          non-steroidal anti-inflammatory drugs for 2 weeks. ?Mauri Pole, MD ?11/15/2021 11:12:14 AM ?This report has been signed electronically. ?

## 2021-11-15 NOTE — Patient Instructions (Signed)
Please read handouts provided. ?Continue present medications. ?Await pathology results. ?Clip Card. ?Repeat colonoscopy in 1 year. ?No aspirin, ibuprofen, naproxen, or other non-steriodal anti-inflammatory drugs for 2 weeks. ? ? ?YOU HAD AN ENDOSCOPIC PROCEDURE TODAY AT [facility]:   Refer to the procedure report that was given to you for any specific questions about what was found during the examination.  If the procedure report does not answer your questions, please call your gastroenterologist to clarify.  If you requested that your care partner not be given the details of your procedure findings, then the procedure report has been included in a sealed envelope for you to review at your convenience later. ? ?YOU SHOULD EXPECT: Some feelings of bloating in the abdomen. Passage of more gas than usual.  Walking can help get rid of the air that was put into your GI tract during the procedure and reduce the bloating. If you had a lower endoscopy (such as a colonoscopy or flexible sigmoidoscopy) you may notice spotting of blood in your stool or on the toilet paper. If you underwent a bowel prep for your procedure, you may not have a normal bowel movement for a few days. ? ?Please Note:  You might notice some irritation and congestion in your nose or some drainage.  This is from the oxygen used during your procedure.  There is no need for concern and it should clear up in a day or so. ? ?SYMPTOMS TO REPORT IMMEDIATELY: ? ?Following lower endoscopy (colonoscopy or flexible sigmoidoscopy): ? Excessive amounts of blood in the stool ? Significant tenderness or worsening of abdominal pains ? Swelling of the abdomen that is new, acute ? Fever of 100?F or higher ? ? ?For urgent or emergent issues, a gastroenterologist can be reached at any hour by calling [phone]. ?Do not use MyChart messaging for urgent concerns.  ? ? ?DIET:  We do recommend a small meal at first, but then you may proceed to your  regular diet.  Drink plenty of fluids but you should avoid alcoholic beverages for 24 hours. ? ?ACTIVITY:  You should plan to take it easy for the rest of today and you should NOT DRIVE or use heavy machinery until tomorrow (because of the sedation medicines used during the test).   ? ?FOLLOW UP: ?Our staff will call the number listed on your records 48-72 hours following your procedure to check on you and address any questions or concerns that you may have regarding the information given to you following your procedure. If we do not reach you, we will leave a message.  We will attempt to reach you two times.  During this call, we will ask if you have developed any symptoms of COVID 19. If you develop any symptoms (ie: fever, flu-like symptoms, shortness of breath, cough etc.) before then, please call [phone].  If you test positive for Covid 19 in the 2 weeks post procedure, please call and report this information to [place].   ? ?If any biopsies were taken you will be contacted by phone or by letter within the next 1-3 weeks.  Please call us at [phone] if you have not heard about the biopsies in 3 weeks.  ? ? ?SIGNATURES/CONFIDENTIALITY: ?You and/or your care partner have signed paperwork which will be entered into your electronic medical record.  These signatures attest to the fact that that the information above on your After Visit Summary has been reviewed and is understood.  Full responsibility of  the confidentiality of this discharge information lies with you and/or your care-partner.  ?

## 2021-11-15 NOTE — Progress Notes (Signed)
[facility] History and Physical ? ? ?Primary Care Physician:  Joya Gaskins, FNP ? ? ?Reason for Procedure:  Irregular bowel habits, history of adenomatous colon polyps ? ?Plan:    colonoscopy with possible interventions as needed ? ? ? ? ?HPI: Amy Burton is a very pleasant 61 y.o. female here for  colonoscopy. ?Please refer to office visit note 08/30/21 ?Denies any nausea, vomiting, abdominal pain, melena or bright red blood per rectum ? ?The risks and benefits as well as alternatives of endoscopic procedure(s) have been discussed and reviewed. All questions answered. The patient agrees to proceed. ? ? ? ?Past Medical History:  ?Diagnosis Date  ? Allergy   ? Anxiety   ? Arthritis   ? Colon polyp   ? Depression   ? Endometriosis   ? Fibroid   ? GERD (gastroesophageal reflux disease)   ? Thyroid disease   ? ? ?Past Surgical History:  ?Procedure Laterality Date  ? ANTERIOR AND POSTERIOR REPAIR  04/15/2019  ? pelvic floor  ? APPENDECTOMY    ? BREAST SURGERY    ? CESAREAN SECTION    ? COLONOSCOPY    ? ENDOMETRIAL BIOPSY  04/27/2020  ? benign  ? endomitriosis  1995  ? HYSTEROSCOPY WITH D & C  05/31/2020  ? benign  ? OOPHORECTOMY Right 1996  ? SALPINGECTOMY Bilateral 1996  ? ? ?Prior to Admission medications   ?Medication Sig Start Date End Date Taking? Authorizing Provider  ?cetirizine (ZYRTEC) 10 MG tablet Take 1 tablet (10 mg total) by mouth daily. 09/07/16  Yes Burns, Claudina Lick, MD  ?fluticasone (FLONASE) 50 MCG/ACT nasal spray Place 2 sprays into both nostrils daily. 07/29/21  Yes [provider]  ?B Complex Vitamins (VITAMIN B COMPLEX PO) Take by mouth. ?Patient not taking: Reported on 08/30/2021    [provider]  ?Cyanocobalamin (VITAMIN B-12 PO) Take 5,000 mg by mouth daily.  ?Patient not taking: Reported on 08/30/2021    [provider]  ?EPINEPHrine 0.3 mg/0.3 mL IJ SOAJ injection  10/05/21   [provider]  ? ? ?Current Outpatient Medications  ?Medication  Sig Dispense Refill  ? cetirizine (ZYRTEC) 10 MG tablet Take 1 tablet (10 mg total) by mouth daily. 30 tablet 11  ? fluticasone (FLONASE) 50 MCG/ACT nasal spray Place 2 sprays into both nostrils daily.    ? B Complex Vitamins (VITAMIN B COMPLEX PO) Take by mouth. (Patient not taking: Reported on 08/30/2021)    ? Cyanocobalamin (VITAMIN B-12 PO) Take 5,000 mg by mouth daily.  (Patient not taking: Reported on 08/30/2021)    ? EPINEPHrine 0.3 mg/0.3 mL IJ SOAJ injection  (Patient not taking: Reported on 11/15/2021)    ? ?Current Facility-Administered Medications  ?Medication Dose Route Frequency Provider Last Rate Last Admin  ? 0.9 %  sodium chloride infusion  500 mL Intravenous Once Modean Mccullum, Venia Minks, MD      ? ? ?Allergies as of 11/15/2021 - Review Complete 11/15/2021  ?Allergen Reaction Noted  ? Sulfa antibiotics Rash 09/29/2015  ? ? ?Family History  ?Problem Relation Age of Onset  ? Hyperlipidemia Mother   ? Hypertension Mother   ? Diabetes Father   ? Prostate cancer Father   ? Arthritis Sister   ? Rheum arthritis Sister   ? Heart disease Brother   ? Heart attack Brother   ? Cancer Maternal Grandmother   ?     breast  ? Colon cancer Neg Hx   ? Esophageal cancer Neg Hx   ?  Rectal cancer Neg Hx   ? Stomach cancer Neg Hx   ? ? ?Social History  ? ?Socioeconomic History  ? Marital status: Married  ?  Spouse name: Not on file  ? Number of children: 3  ? Years of education: Not on file  ? Highest education level: Not on file  ?Occupational History  ? Not on file  ?Tobacco Use  ? Smoking status: Never  ? Smokeless tobacco: Never  ?Vaping Use  ? Vaping Use: Never used  ?Substance and Sexual Activity  ? Alcohol use: Yes  ?  Comment: 1 glass of wine per month  ? Drug use: No  ? Sexual activity: Yes  ?  Birth control/protection: Surgical  ?  Comment: Salpingectomy/Oophorectomy   ?Other Topics Concern  ? Not on file  ?Social History Narrative  ? Not on file  ? ?Social Determinants of Health  ? ?Financial Resource Strain: Not on  file  ?Food Insecurity: Not on file  ?Transportation Needs: Not on file  ?Physical Activity: Not on file  ?Stress: Not on file  ?Social Connections: Not on file  ?Intimate Partner Violence: Not on file  ? ? ?Review of Systems: ? ?All other review of systems negative except as mentioned in the HPI. ? ?Physical Exam: ?Vital signs in last 24 hours: ?BP 124/74   Pulse 97   Temp (!) 97.1 ?F (36.2 ?C) (Temporal)   Ht '5\' 5"'$  (1.651 m)   Wt 169 lb (76.7 kg)   LMP 10/26/2015 (Within Months)   SpO2 100%   BMI 28.12 kg/m?  ?General:   Alert, NAD ?Lungs:  Clear .   ?Heart:  Regular rate and rhythm ?Abdomen:  Soft, nontender and nondistended. ?Neuro/Psych:  Alert and cooperative. Normal mood and affect. A and O x 3 ? ?Reviewed labs, radiology imaging, old records and pertinent past GI work up ? ?Patient is appropriate for planned procedure(s) and anesthesia in an ambulatory setting ? ? ?K. Denzil Magnuson , MD ?[phone]  ? ? ?  ?

## 2021-11-17 ENCOUNTER — Telehealth: Payer: Self-pay | Admitting: *Deleted

## 2021-11-17 NOTE — Telephone Encounter (Signed)
?  Follow up Call- ? ? ?  11/15/2021  ?  9:52 AM  ?Call back number  ?Post procedure Call Back phone  # [phone]  ?Permission to leave phone message Yes  ?  ? ?Patient questions: ? ?Do you have a fever, pain , or abdominal swelling? No. ?Pain Score  0 * ? ?Have you tolerated food without any problems? Yes.   ? ?Have you been able to return to your normal activities? Yes.   ? ?Do you have any questions about your discharge instructions: ?Diet   No. ?Medications  No. ?Follow up visit  No. ? ?Do you have questions or concerns about your Care? No. ? ?Actions: ?* If pain score is 4 or above: ?No action needed, pain <4. ? ? ?

## 2021-11-20 ENCOUNTER — Encounter: Payer: Self-pay | Admitting: Physical Therapy

## 2021-11-20 ENCOUNTER — Other Ambulatory Visit: Payer: Self-pay

## 2021-11-20 ENCOUNTER — Ambulatory Visit: Payer: 59 | Admitting: Physical Therapy

## 2021-11-20 DIAGNOSIS — M6281 Muscle weakness (generalized): Secondary | ICD-10-CM

## 2021-11-20 DIAGNOSIS — R278 Other lack of coordination: Secondary | ICD-10-CM

## 2021-11-20 NOTE — Therapy (Signed)
[facility] [place] ?[place], [place], [address] ?Phone: [phone]   Fax:  [phone] ? ?Physical Therapy Treatment ? ?Patient Details  ?Name: Amy Burton ?MRN: [id number] ?Date of Birth: March 20, 1961 ?Referring Provider (PT): Dr. Aundria Rud ? ? ?Encounter Date: 11/20/2021 ? ? PT End of Session - 11/20/21 1106   ? ? Visit Number 10   ? Date for PT Re-Evaluation 12/30/21   ? Authorization Type [employer]   ? Authorization - Visit Number 10   ? Authorization - Number of Visits 30   ? PT Start Time 1100   ? PT Stop Time 1140   ? PT Time Calculation (min) 40 min   ? Activity Tolerance Patient tolerated treatment well;No increased pain   ? Behavior During Therapy [facility] for tasks assessed/performed   ? ?  ?  ? ?  ? ? ?Past Medical History:  ?Diagnosis Date  ? Allergy   ? Anxiety   ? Arthritis   ? Colon polyp   ? Depression   ? Endometriosis   ? Fibroid   ? GERD (gastroesophageal reflux disease)   ? Thyroid disease   ? ? ?Past Surgical History:  ?Procedure Laterality Date  ? ANTERIOR AND POSTERIOR REPAIR  04/15/2019  ? pelvic floor  ? APPENDECTOMY    ? BREAST SURGERY    ? CESAREAN SECTION    ? COLONOSCOPY    ? ENDOMETRIAL BIOPSY  04/27/2020  ? benign  ? endomitriosis  1995  ? HYSTEROSCOPY WITH D & C  05/31/2020  ? benign  ? OOPHORECTOMY Right 1996  ? SALPINGECTOMY Bilateral 1996  ? ? ?There were no vitals filed for this visit. ? ? Subjective Assessment - 11/20/21 1106   ? ? Subjective I have 4 clamps in my colon so I had to take it easy. I have been doing exercises but being easy on it. I had 2 polyps and one was 24 mm. I see Dr. Silverio Decamp. No pain due to cleanse and having to take it easy. No intimate time with husband.Tuesday the chiropractor did alot of adjustments. I did yard work but no joint or pelvic floor pain.   ? Patient Stated Goals to have a normal bowel movements,   ? Currently in Pain? No/denies   ? ?  ?  ? ?  ? ? ? ? ? [facility] PT  Assessment - 11/20/21 0001   ? ?  ? Assessment  ? Medical Diagnosis n81.6 Rectocele; 39.498 Other urinary incontinence   ? Referring Provider (PT) Dr. Aundria Rud   ? Prior Therapy none   ?  ? Precautions  ? Precautions None   ?  ? Cognition  ? Overall Cognitive Status Within Functional Limits for tasks assessed   ?  ? ROM / Strength  ? AROM / PROM / Strength AROM;PROM;Strength   ?  ? Strength  ? Right Hip Flexion 4+/5   ? Right Hip Extension 4+/5   ? Right Hip ABduction 5/5   ? Left Hip Flexion 4+/5   ? Left Hip Extension 4+/5   ? Left Hip ABduction 4+/5   ? ?  ?  ? ?  ? ? ? ? ? ? ? ? ? ? ? ? ? Pelvic Floor Special Questions - 11/20/21 0001   ? ? Urinary Leakage No   ? Fecal incontinence No   ? Pelvic Floor Internal Exam Patient confirmed identification and approves PT to assess pelvic  floor and treatment   ? Exam Type Vaginal   ? Strength good squeeze, good lift, able to hold agaisnt strong resistance   ? ?  ?  ? ?  ? ? ? ? [facility] Adult PT Treatment/Exercise - 11/20/21 0001   ? ?  ? Self-Care  ? Self-Care Other Self-Care Comments   ? Other Self-Care Comments  educated patient on how to use the vaginal wand and release the pelvic floor muscles, using the lubricant, and showed her on a diagram where she is with the wand.   ?  ? Manual Therapy  ? Manual Therapy Internal Pelvic Floor   ? Internal Pelvic Floor manual work to the left side of the pelvic floor releasing the tissue to reduce trigger points and elongation of tissue   ? ?  ?  ? ?  ? ? ? ? ? ? ? ? ? ? PT Education - 11/20/21 1156   ? ? Education Details educated patient on how to perform manual work on the pelvic floor using the vaginal wand and diagram of the pelvic floor muscles   ? Person(s) Educated Patient   ? Methods Explanation;Demonstration;Handout   ? Comprehension Returned demonstration;Verbalized understanding   ? ?  ?  ? ?  ? ? ? PT Short Term Goals - 11/20/21 1112   ? ?  ? PT SHORT TERM GOAL #1  ? Title independent with initial HEP  with flexibility exercises   ? Time 4   ? Period Weeks   ? Status Achieved   ? Target Date 09/29/21   ?  ? PT SHORT TERM GOAL #2  ? Title understand correct toileting technique to reduce strain on the pelvic floor   ? Time 4   ? Period Weeks   ? Status Achieved   ? Target Date 09/29/21   ?  ? PT SHORT TERM GOAL #3  ? Title how to splint the pelvic floor to reduce rectocele while bathrooming   ? Time 4   ? Status Achieved   ? Target Date 09/29/21   ?  ? PT SHORT TERM GOAL #4  ? Title understand how to perform abdominal massage to promote improved bowel health and decrease strain on the pelvic floor   ? Time 4   ? Period Weeks   ? Status Achieved   ? Target Date 09/29/21   ? ?  ?  ? ?  ? ? ? ? PT Long Term Goals - 11/20/21 1113   ? ?  ? PT LONG TERM GOAL #1  ? Title independent with HEP and understand how to progress herself   ? Time 4   ? Period Months   ? Status On-going   ?  ? PT LONG TERM GOAL #2  ? Title able to bulge the pelvis floor correctly to push the stool out and not strain   ? Time 4   ? Period Months   ? Status Achieved   ?  ? PT LONG TERM GOAL #3  ? Title straining to have a bowel movement decreased >/= 75%   ? Time 4   ? Period Months   ? Status Achieved   ? Target Date 12/31/21   ?  ? PT LONG TERM GOAL #4  ? Title bilateral hip abduction >/= 4/5 to assist abdominals to contract  to have a bowel movement correctlu   ? Time 4   ? Period Months   ? Status On-going   ?  Target Date 12/31/21   ? ?  ?  ? ?  ? ? ? ? ? ? ? ? Plan - 11/20/21 1213   ? ? Clinical Impression Statement Patient understands how to use the vaginal wand at home to work on trigger points in the floor. She is straining 75% less. Patient had a colonoscopy done and 2 polyps was removed. She will talk to the Dr.Nandigam on Friday to go over the reults. Patient can tell when she has not exercised in a few days and feels weaker. Patient will benefit from skilled therapy to improve pelvic floor coordination to improve bowel movements and  reduce straining.   ? Personal Factors and Comorbidities Comorbidity 3+;Past/Current Experience   ? Comorbidities anterior and posterior repair 04/15/2019; appendectomey; c-section   ? Examination-Activity Limitations Toileting   ? Stability/Clinical Decision Making Stable/Uncomplicated   ? Rehab Potential Excellent   ? PT Frequency 1x / week   ? PT Duration Other (comment)   4 months  ? PT Treatment/Interventions ADLs/Self Care Home Management;Biofeedback;Therapeutic activities;Therapeutic exercise;Neuromuscular re-education;Patient/family education;Manual techniques;Dry needling;Taping;Joint Manipulations   ? PT Next Visit Plan see what Dr/ Peggye Ley says; see how it is going with the vaginal wand ;possible discharge   ? PT Home Exercise Plan Access Code: [email or web address]   ? Consulted and Agree with Plan of Care Patient   ? ?  ?  ? ?  ? ? ?Patient will benefit from skilled therapeutic intervention in order to improve the following deficits and impairments:  Decreased coordination, Increased fascial restricitons, Decreased strength, Decreased activity tolerance ? ?Visit Diagnosis: ?Other lack of coordination ? ?Muscle weakness (generalized) ? ? ? ? ?Problem List ?Patient Active Problem List  ? Diagnosis Date Noted  ? Dysuria 10/22/2017  ? Nonallopathic lesion of thoracic region 05/15/2017  ? Nonallopathic lesion of cervical region 05/15/2017  ? Nonallopathic lesion of sacral region 05/15/2017  ? Chronic neck pain 04/30/2017  ? Chronic back pain 04/30/2017  ? Arthralgia of hip 04/30/2017  ? Pelvic prolapse 04/30/2017  ? 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  ? Eczema 09/07/2016  ? Urinary urgency 09/07/2016  ? Chronic sinusitis 09/29/2015  ? Allergic rhinitis 09/29/2015  ? Prediabetes 09/29/2015  ? Vitamin D deficiency 09/29/2015  ? Situational anxiety-flying 09/29/2015  ? ETD (Eustachian tube dysfunction), bilateral 09/29/2015  ? ? ?Earlie Counts, PT ?11/20/21 12:29 PM ? ? ? ?[facility]  [place] ?[place], [place], [address] ?Phone: [phone]   Fax:  [phone] ? ?Name: Amy Burton ?MRN: [id number] ?Date of Birth: [birth year] ? ? ? ?

## 2021-11-23 ENCOUNTER — Encounter: Payer: Self-pay | Admitting: Gastroenterology

## 2021-11-24 ENCOUNTER — Encounter: Payer: Self-pay | Admitting: Gastroenterology

## 2021-11-24 ENCOUNTER — Ambulatory Visit: Payer: 59 | Admitting: Gastroenterology

## 2021-11-24 VITALS — BP 110/80 | HR 78 | Ht 64.5 in | Wt 168.0 lb

## 2021-11-24 DIAGNOSIS — Z860100 Personal history of colon polyps, unspecified: Secondary | ICD-10-CM

## 2021-11-24 DIAGNOSIS — R1013 Epigastric pain: Secondary | ICD-10-CM

## 2021-11-24 DIAGNOSIS — K582 Mixed irritable bowel syndrome: Secondary | ICD-10-CM

## 2021-11-24 DIAGNOSIS — K219 Gastro-esophageal reflux disease without esophagitis: Secondary | ICD-10-CM | POA: Diagnosis not present

## 2021-11-24 MED ORDER — FAMOTIDINE 20 MG PO TABS: 20.0000 mg | ORAL_TABLET | Freq: Every day | ORAL | 3 refills | Status: DC

## 2021-11-24 NOTE — Patient Instructions (Addendum)
We have sent the following medications to your pharmacy for you to pick up at your convenience:   Famotidine 20 mg  ? ?Continue pelvic floor therapy ? ?Increase water intake 8 cups a day ? ?Use OTC FDgard/IBgard three times a day as needed ? ?Follow up in 1 year ? ?Your recall colonoscopy will be in 10/2022, we will mail you a reminder letter ? ?If you are age 62 or older, your body mass index should be between 23-30. Your Body mass index is 28.39 kg/m?Marland Kitchen If this is out of the aforementioned range listed, please consider follow up with your Primary Care Provider. ? ?If you are age 7 or younger, your body mass index should be between 19-25. Your Body mass index is 28.39 kg/m?Marland Kitchen If this is out of the aformentioned range listed, please consider follow up with your Primary Care Provider.  ? ?________________________________________________________ ? ?The [facility] providers would like to encourage you to use [facility] to communicate with providers for non-urgent requests or questions.  Due to long hold times on the telephone, sending your provider a message by [facility] may be a faster and more efficient way to get a response.  Please allow 48 business hours for a response.  Please remember that this is for non-urgent requests.  ?_______________________________________________________  ? ?I appreciate the  opportunity to care for you ? ?Thank You  ? ?Harl Bowie , MD  ? ?

## 2021-11-24 NOTE — Progress Notes (Signed)
? ?       ? ?Analiza Cowger    [id number]    1961-02-16 ? ?Primary Care Physician:Morrison, Corliss Skains, FNP ? ?Referring Physician: Joya Gaskins, [place] ?[address] ?[place],  [place] [address] ? ? ?Chief complaint:  IBS constipation and  diarrhea, Pelvic floor dysfunction ? ?HPI: ? ?61 year old very pleasant female here f for follow-up visit for IBS with alternating constipation and diarrhea  ? ?She feels her bowel habits are improving since she started pelvic floor physical therapy ?Denies any melena or rectal bleeding.  Overall she is feeling better ? ?Colonoscopy 11/15/21 ?- One 2 mm polyp at the appendiceal orifice, removed with a cold snare. Resected and ?retrieved. ?- One 24 mm polyp in the ascending colon, removed piecemeal using a cold snare. Resected ?and retrieved. Clips (MR conditional) were placed. ?1. Surgical [P], colon, cecum, polyp (1) ?- COLONIC MUCOSA WITH BENIGN LYMPHOID AGGREGATE. ?2. Surgical [P], colon, ascending, polyp (1) ?- SESSILE SERRATED POLYP(S) WITHOUT CYTOLOGIC DYSPLASIA. ?  ?Prior to that Colonoscopy 7 years ago at [facility], report is not available to review but was normal per patient, had a benign polyp removed ?  ?  ?Her son has Crohn's disease ? ? ?Outpatient Encounter Medications as of 11/24/2021  ?Medication Sig  ? B Complex Vitamins (VITAMIN B COMPLEX PO) Take by mouth. (Patient not taking: Reported on 08/30/2021)  ? cetirizine (ZYRTEC) 10 MG tablet Take 1 tablet (10 mg total) by mouth daily.  ? Cyanocobalamin (VITAMIN B-12 PO) Take 5,000 mg by mouth daily.  (Patient not taking: Reported on 08/30/2021)  ? EPINEPHrine 0.3 mg/0.3 mL IJ SOAJ injection  (Patient not taking: Reported on 11/15/2021)  ? fluticasone (FLONASE) 50 MCG/ACT nasal spray Place 2 sprays into both nostrils daily.  ? ?No facility-administered encounter medications on file as of 11/24/2021.  ? ? ?Allergies as of 11/24/2021 - Review Complete 11/24/2021  ?Allergen Reaction Noted  ? Sulfa antibiotics  Rash 09/29/2015  ? ? ?Past Medical History:  ?Diagnosis Date  ? Allergy   ? Anxiety   ? Arthritis   ? Colon polyp   ? Depression   ? Endometriosis   ? Fibroid   ? GERD (gastroesophageal reflux disease)   ? Thyroid disease   ? ? ?Past Surgical History:  ?Procedure Laterality Date  ? ANTERIOR AND POSTERIOR REPAIR  04/15/2019  ? pelvic floor  ? APPENDECTOMY    ? BREAST SURGERY    ? CESAREAN SECTION    ? COLONOSCOPY    ? ENDOMETRIAL BIOPSY  04/27/2020  ? benign  ? endomitriosis  1995  ? HYSTEROSCOPY WITH D & C  05/31/2020  ? benign  ? OOPHORECTOMY Right 1996  ? SALPINGECTOMY Bilateral 1996  ? ? ?Family History  ?Problem Relation Age of Onset  ? Hyperlipidemia Mother   ? Hypertension Mother   ? Diabetes Father   ? Prostate cancer Father   ? Arthritis Sister   ? Rheum arthritis Sister   ? Heart disease Brother   ? Heart attack Brother   ? Cancer Maternal Grandmother   ?     breast  ? Colon cancer Neg Hx   ? Esophageal cancer Neg Hx   ? Rectal cancer Neg Hx   ? Stomach cancer Neg Hx   ? ? ?Social History  ? ?Socioeconomic History  ? Marital status: Married  ?  Spouse name: Not on file  ? Number of children: 3  ? Years of education: Not on file  ?  Highest education level: Not on file  ?Occupational History  ? Not on file  ?Tobacco Use  ? Smoking status: Never  ? Smokeless tobacco: Never  ?Vaping Use  ? Vaping Use: Never used  ?Substance and Sexual Activity  ? Alcohol use: Yes  ?  Comment: 1 glass of wine per month  ? Drug use: No  ? Sexual activity: Yes  ?  Birth control/protection: Surgical  ?  Comment: Salpingectomy/Oophorectomy   ?Other Topics Concern  ? Not on file  ?Social History Narrative  ? Not on file  ? ?Social Determinants of Health  ? ?Financial Resource Strain: Not on file  ?Food Insecurity: Not on file  ?Transportation Needs: Not on file  ?Physical Activity: Not on file  ?Stress: Not on file  ?Social Connections: Not on file  ?Intimate Partner Violence: Not on file  ? ? ? ? ?Review of systems: ?All other  review of systems negative except as mentioned in the HPI. ? ? ?Physical Exam: ?Vitals:  ? 11/24/21 1056  ?BP: 110/80  ?Pulse: 78  ? ?Body mass index is 28.39 kg/m?. ?Gen:      No acute distress ?Neuro: alert and oriented x 3 ?Psych: normal mood and affect ? ?Data Reviewed: ? ?Reviewed labs, radiology imaging, old records and pertinent past GI work up ? ? ?Assessment and Plan/Recommendations: ? ?61 year old very pleasant female with chronic irritable bowel syndrome with alternating constipation and diarrhea, pelvic floor dysfunction and dyssynergic defecation improving ?Continue pelvic floor physical therapy and biofeedback ?Use Benefiber 1 tablespoon twice daily with meals ? ?History of advanced adenomatous colon polyps, s/p piecemeal removal of large sessile serrated adenoma.  Due for surveillance colonoscopy March 2024 ? ?Use FD guard 1 capsule up to 3 times daily as needed for dyspepsia symptoms and IBgard 1 capsule up to 3 times daily as needed for IBS symptoms and bloating ? ?Intermittent heartburn secondary to GERD: Use famotidine 20 mg twice daily as needed ?Antireflux measures ? ?Return in 1 year or sooner if needed ? ?This visit required 30 minutes of patient care (this includes precharting, chart review, review of results, face-to-face time used for counseling as well as treatment plan and follow-up. The patient was provided an opportunity to ask questions and all were answered. The patient agreed with the plan and demonstrated an understanding of the instructions. ? ?K. Denzil Magnuson , MD ?  ? ?CC: Joya Gaskins, * ? ? ?

## 2021-11-30 ENCOUNTER — Encounter: Payer: Self-pay | Admitting: Physical Therapy

## 2021-12-18 ENCOUNTER — Encounter: Payer: Self-pay | Admitting: Physical Therapy

## 2021-12-18 ENCOUNTER — Ambulatory Visit: Payer: 59 | Attending: Obstetrics and Gynecology | Admitting: Physical Therapy

## 2021-12-18 DIAGNOSIS — M6281 Muscle weakness (generalized): Secondary | ICD-10-CM | POA: Insufficient documentation

## 2021-12-18 DIAGNOSIS — R278 Other lack of coordination: Secondary | ICD-10-CM | POA: Insufficient documentation

## 2021-12-18 NOTE — Therapy (Signed)
[facility] ?[place], [place], [address] ?Phone: [phone]   Fax:  [phone] ? ?Physical Therapy Treatment ? ?Patient Details  ?Name: Amy Burton ?MRN: [id number] ?Date of Birth: September 17, 1960 ?Referring Provider (PT): Dr. Aundria Rud ? ? ?Encounter Date: 12/18/2021 ? ? PT End of Session - 12/18/21 1233   ? ? Visit Number 11   ? Date for PT Re-Evaluation 12/30/21   ? Authorization Type [employer]ign[employer]   ? Authorization - Visit Number 11   ? Authorization - Number of Visits 30   ? PT Start Time 1230   ? PT Stop Time 1310   ? PT Time Calculation (min) 40 min   ? Activity Tolerance Patient tolerated treatment well;No increased pain   ? Behavior During Therapy [facility] for tasks assessed/performed   ? ?  ?  ? ?  ? ? ?Past Medical History:  ?Diagnosis Date  ? Allergy   ? Anxiety   ? Arthritis   ? Colon polyp   ? Depression   ? Endometriosis   ? Fibroid   ? GERD (gastroesophageal reflux disease)   ? Thyroid disease   ? ? ?Past Surgical History:  ?Procedure Laterality Date  ? ANTERIOR AND POSTERIOR REPAIR  04/15/2019  ? pelvic floor  ? APPENDECTOMY    ? BREAST SURGERY    ? CESAREAN SECTION    ? COLONOSCOPY    ? ENDOMETRIAL BIOPSY  04/27/2020  ? benign  ? endomitriosis  1995  ? HYSTEROSCOPY WITH D & C  05/31/2020  ? benign  ? OOPHORECTOMY Right 1996  ? SALPINGECTOMY Bilateral 1996  ? ? ?There were no vitals filed for this visit. ? ? Subjective Assessment - 12/18/21 1234   ? ? Subjective Dr. Silverio Decamp wants me to keep up with the therapy. Polyp was fine. I am feeling stronger.   ? Patient Stated Goals to have a normal bowel movements,   ? Currently in Pain? No/denies   ? ?  ?  ? ?  ? ? ? ? ? [facility] PT Assessment - 12/18/21 0001   ? ?  ? Assessment  ? Medical Diagnosis n81.6 Rectocele; 39.498 Other urinary incontinence   ? Referring Provider (PT) Dr. Aundria Rud   ? Prior Therapy none   ?  ? Precautions  ? Precautions None   ?  ? Cognition  ?  Overall Cognitive Status Within Functional Limits for tasks assessed   ?  ? Strength  ? Right Hip Flexion 5/5   ? Right Hip Extension 5/5   ? Right Hip ABduction 5/5   ? Left Hip Flexion 5/5   ? Left Hip Extension 4+/5   ? Left Hip ABduction 4+/5   ? ?  ?  ? ?  ? ? ? ? ? ? ? ? ? ? ? ? ? Pelvic Floor Special Questions - 12/18/21 0001   ? ? Fecal incontinence No   has to strain at times  ? Skin Integrity Intact;Hemorroids   ? Scar Restricted   ? Pelvic Floor Internal Exam Patient confirmed identification and approves PT to assess pelvic floor and treatment   ? Exam Type Rectal   ? Palpation first attempt to push therapist finger out of the anus she contracted and the other time was able push the therapist finger out   ? Strength good squeeze, good lift, able to hold agaisnt strong resistance   vaginal 4/5  ? ?  ?  ? ?  ? ? ? ?  [facility] Adult PT Treatment/Exercise - 12/18/21 0001   ? ?  ? Self-Care  ? Self-Care Other Self-Care Comments   ? Other Self-Care Comments  discussed with patient on using a natural path to assist her with her gut and where to look for one due to patien thaving an interest in this   ?  ? Neuro Re-ed   ? Neuro Re-ed Details  working on anal contraction to get a lift, then working on toileting with patient pushing therapist finger out of the anal canal.   ?  ? Manual Therapy  ? Manual Therapy Internal Pelvic Floor   ? Internal Pelvic Floor manual work to the rectum, EAS, IAS, and anterior wall of the rectum working through trigger points   ? ?  ?  ? ?  ? ? ? ? ? ? ? ? ? ? PT Education - 12/18/21 1320   ? ? Education Details Access Code: [email or web address]; education on other ways to have someone work with her and her gut using a natural path   ? Person(s) Educated Patient   ? Methods Explanation;Demonstration;Handout   ? Comprehension Verbalized understanding;Returned demonstration   ? ?  ?  ? ?  ? ? ? PT Short Term Goals - 12/18/21 1300   ? ?  ? PT SHORT TERM GOAL #1  ? Title independent with initial HEP with  flexibility exercises   ? Time 4   ? Period Weeks   ? Status Achieved   ?  ? PT SHORT TERM GOAL #2  ? Title understand correct toileting technique to reduce strain on the pelvic floor   ? Time 4   ? Status Achieved   ?  ? PT SHORT TERM GOAL #3  ? Title how to splint the pelvic floor to reduce rectocele while bathrooming   ? Time 4   ? Period Weeks   ? Status Achieved   ?  ? PT SHORT TERM GOAL #4  ? Title understand how to perform abdominal massage to promote improved bowel health and decrease strain on the pelvic floor   ? Time 4   ? Period Weeks   ? Status Achieved   ? Target Date 09/29/21   ? ?  ?  ? ?  ? ? ? ? PT Long Term Goals - 12/18/21 1300   ? ?  ? PT LONG TERM GOAL #1  ? Title independent with HEP and understand how to progress herself   ? Time 4   ? Period Months   ? Status On-going   ? Target Date 12/30/21   ?  ? PT LONG TERM GOAL #2  ? Title able to bulge the pelvis floor correctly to push the stool out and not strain   ? Baseline still needs verbal cues   ? Time 4   ? Period Months   ? Status On-going   ? Target Date 12/31/21   ?  ? PT LONG TERM GOAL #3  ? Title straining to have a bowel movement decreased >/= 75%   ? Baseline only when she does the correct breathing   ? Time 4   ? Period Months   ? Status On-going   ?  ? PT LONG TERM GOAL #4  ? Title bilateral hip abduction >/= 4/5 to assist abdominals to contract  to have a bowel movement correctlu   ? Time 4   ? Period Months   ? Status Achieved   ? ?  ?  ? ?  ? ? ? ? ? ? ? ?  Plan - 12/18/21 1517   ? ? Clinical Impression Statement Patient is able to bulge her pelvic floor with breath work. She will initially contract her pelvic floor when therapist is giving tactile cues but when she is breathing correctly she is able to push the therapist finger out of the canal. She has trigger points in the rectum, EAS, and IAS. Pelvic floor strength for vaginally and rectally is 4/5 after manual work. bilateral hip strength is increased but has weakness in the  hip abductors. She is able to engage her core better with next level of core work but is still working in improving her strength. Patient will benefit from skilled therapy to improve core and pelvic floor strength with coordination to improve toileting.   ? Personal Factors and Comorbidities Comorbidity 3+;Past/Current Experience   ? Comorbidities anterior and posterior repair 04/15/2019; appendectomey; c-section   ? Examination-Activity Limitations Toileting   ? Stability/Clinical Decision Making Stable/Uncomplicated   ? Rehab Potential Excellent   ? PT Frequency 1x / week   every other week to update HEP  ? PT Duration Other (comment)   4 months  ? PT Treatment/Interventions ADLs/Self Care Home Management;Biofeedback;Therapeutic activities;Therapeutic exercise;Neuromuscular re-education;Patient/family education;Manual techniques;Dry needling;Taping;Joint Manipulations   ? PT Next Visit Plan work on core strength; manual work internally for rectum and vagina   ? PT Home Exercise Plan Access Code: [email or web address]   ? Consulted and Agree with Plan of Care Patient   ? ?  ?  ? ?  ? ? ?Patient will benefit from skilled therapeutic intervention in order to improve the following deficits and impairments:  Decreased coordination, Increased fascial restricitons, Decreased strength, Decreased activity tolerance ? ?Visit Diagnosis: ?Other lack of coordination ? ?Muscle weakness (generalized) ? ? ? ? ?Problem List ?Patient Active Problem List  ? Diagnosis Date Noted  ? Dysuria 10/22/2017  ? Nonallopathic lesion of thoracic region 05/15/2017  ? Nonallopathic lesion of cervical region 05/15/2017  ? Nonallopathic lesion of sacral region 05/15/2017  ? Chronic neck pain 04/30/2017  ? Chronic back pain 04/30/2017  ? Arthralgia of hip 04/30/2017  ? Pelvic prolapse 04/30/2017  ? 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  ? Eczema 09/07/2016  ? Urinary urgency 09/07/2016  ? Chronic sinusitis 09/29/2015  ? Allergic rhinitis 09/29/2015  ? Prediabetes  09/29/2015  ? Vitamin D deficiency 09/29/2015  ? Situational anxiety-flying 09/29/2015  ? ETD (Eustachian tube dysfunction), bilateral 09/29/2015  ? ? ?Earlie Counts, PT ?12/18/21 3:23 PM ? ?[place] ?C

## 2021-12-18 NOTE — Patient Instructions (Addendum)
[facility]  [place] ? ?Facebook ?[email or web address] ? ... ? [facility] ?herbal path in  [place] from [email or web address] ?[address] [place], [place], [place], [place], [place]. ??. [phone]. ??. [email or web address]. ??. [email or web address]. ? ?Access Code: [email or web address] ?URL: [email or web address] ?Date: 12/18/2021 ?Prepared by: Earlie Counts ? ?Program Notes ?sidely move hand down thigh 10x each side ? ?Exercises ?- Supine Hamstring Stretch  - 1 x daily - 3 x weekly - 1 sets - 2 reps - 30 sec hold ?- Supine Piriformis Stretch with Leg Straight  - 1 x daily - 3 x weekly - 2 sets - 2 reps - 30 sec hold ?- V Sit Hip Adductor Hamstring Stretch  - 1 x daily - 3 x weekly - 1 sets - 2 reps - 30 sec hold ?- Standing Quadriceps Stretch  - 1 x daily - 3 x weekly - 2 sets - 2 reps - 30 sec hold ?- Supine Transversus Abdominis Bracing - Hands on Stomach  - 1 x daily - 7 x weekly - 1 sets - 10 reps ?- Prone Hip Abduction on Slider  - 1 x daily - 3 x weekly - 2 sets - 10 reps ?- Quadruped Hip Hike on Foam  - 1 x daily - 3 x weekly - 2 sets - 10 reps ?- Standing Shoulder Extension with Resistance  - 1 x daily - 3 x weekly - 2 sets - 10 reps ?- Standing Hip Hinge  - 1 x daily - 3 x weekly - 1 sets - 10 reps ?- Marching Bridge  - 1 x daily - 3 x weekly - 1 sets - 10 reps ?- Supine Dead Bug with Leg Extension  - 1 x daily - 3 x weekly - 1 sets - 10 reps ?- Beginner Front Arm Support  - 1 x daily - 7 x weekly - 1 sets - 10 reps ? ?[facility] ?[address], [address] ?[place], [place] [address] ?Phone # [phone] ?Fax [phone] ? ?

## 2022-01-17 ENCOUNTER — Ambulatory Visit: Payer: 59 | Admitting: Physical Therapy

## 2022-01-17 ENCOUNTER — Other Ambulatory Visit: Payer: Self-pay | Admitting: *Deleted

## 2022-01-17 ENCOUNTER — Other Ambulatory Visit: Payer: Self-pay | Admitting: Obstetrics and Gynecology

## 2022-01-17 DIAGNOSIS — Z1231 Encounter for screening mammogram for malignant neoplasm of breast: Secondary | ICD-10-CM

## 2022-01-31 ENCOUNTER — Ambulatory Visit
Admission: RE | Admit: 2022-01-31 | Discharge: 2022-01-31 | Disposition: A | Discharge status: Home / Self Care | Payer: 59 | Source: Ambulatory Visit | Attending: *Deleted | Admitting: *Deleted

## 2022-01-31 DIAGNOSIS — Z1231 Encounter for screening mammogram for malignant neoplasm of breast: Secondary | ICD-10-CM

## 2022-02-01 ENCOUNTER — Other Ambulatory Visit: Payer: Self-pay | Admitting: Obstetrics and Gynecology

## 2022-02-01 DIAGNOSIS — R928 Other abnormal and inconclusive findings on diagnostic imaging of breast: Secondary | ICD-10-CM

## 2022-02-12 ENCOUNTER — Other Ambulatory Visit: Payer: Self-pay | Admitting: Obstetrics and Gynecology

## 2022-02-12 ENCOUNTER — Ambulatory Visit
Admission: RE | Admit: 2022-02-12 | Discharge: 2022-02-12 | Disposition: A | Discharge status: Home / Self Care | Payer: 59 | Source: Ambulatory Visit | Attending: Obstetrics and Gynecology | Admitting: Obstetrics and Gynecology

## 2022-02-12 DIAGNOSIS — R928 Other abnormal and inconclusive findings on diagnostic imaging of breast: Secondary | ICD-10-CM

## 2022-02-12 DIAGNOSIS — N6320 Unspecified lump in the left breast, unspecified quadrant: Secondary | ICD-10-CM

## 2022-02-14 ENCOUNTER — Encounter: Payer: Self-pay | Admitting: Physical Therapy

## 2022-02-14 ENCOUNTER — Ambulatory Visit: Payer: 59 | Attending: Obstetrics and Gynecology | Admitting: Physical Therapy

## 2022-02-14 DIAGNOSIS — R278 Other lack of coordination: Secondary | ICD-10-CM | POA: Diagnosis present

## 2022-02-14 DIAGNOSIS — M6281 Muscle weakness (generalized): Secondary | ICD-10-CM | POA: Insufficient documentation

## 2022-02-14 NOTE — Therapy (Signed)
[facility] [place], [place], [address] Phone: [phone]   Fax:  [phone]  Physical Therapy Treatment  Patient Details  Name: Amy Burton MRN: [id number] Date of Birth: 02-24-1961 Referring Provider (PT): Dr. Aundria Rud   Encounter Date: 02/14/2022   PT End of Session - 02/14/22 0935     Visit Number 12    Date for PT Re-Evaluation 05/02/22    Authorization Type Cigna    Authorization - Visit Number 12    Authorization - Number of Visits 30    PT Start Time 0930    PT Stop Time 1000    PT Time Calculation (min) 30 min    Activity Tolerance Patient tolerated treatment well;No increased pain    Behavior During Therapy WFL for tasks assessed/performed             Past Medical History:  Diagnosis Date   Allergy    Anxiety    Arthritis    Colon polyp    Depression    Endometriosis    Fibroid    GERD (gastroesophageal reflux disease)    Thyroid disease     Past Surgical History:  Procedure Laterality Date   ANTERIOR AND POSTERIOR REPAIR  04/15/2019   pelvic floor   APPENDECTOMY     BREAST SURGERY     CESAREAN SECTION     COLONOSCOPY     ENDOMETRIAL BIOPSY  04/27/2020   benign   endomitriosis  1995   HYSTEROSCOPY WITH D & C  05/31/2020   benign   OOPHORECTOMY Right 1996   SALPINGECTOMY Bilateral 1996    There were no vitals filed for this visit.   Subjective Assessment - 02/14/22 0935     Subjective Things are moving better. I still splint to get the stool moving. I splinting 50% of the time instead of all the time for a short period of time to get it going. I used to do it off an don throughout the bowel movement. Patient had pain in the vagina for the first time while doing the belly dancing, i am doing squats a few per day. No intercourse at this time. I went to the chiropractor and he put my hip back in. when my hip is like this I am not able to do the  particular exericse.    Patient Stated Goals to have a normal bowel movements,    Currently in Pain? No/denies    Multiple Pain Sites No                [facility] PT Assessment - 02/14/22 0001       Assessment   Medical Diagnosis n81.6 Rectocele; 39.498 Other urinary incontinence    Referring Provider (PT) Dr. Aundria Rud    Prior Therapy none      Precautions   Precautions None      Cognition   Overall Cognitive Status Within Functional Limits for tasks assessed      Strength   Right Hip Flexion 5/5    Right Hip Extension 5/5    Right Hip ABduction 5/5    Left Hip Flexion 5/5    Left Hip Extension 4+/5    Left Hip ABduction 4+/5                        Pelvic Floor Special Questions - 02/14/22 0001     Pelvic Floor Internal Exam  Patient confirmed identification and approves PT to assess pelvic floor and treatment    Exam Type Vaginal;Rectal    Strength --   5/5 rectal, 4/5 vaginal              [facility] Adult PT Treatment/Exercise - 02/14/22 0001       Neuro Re-ed    Neuro Re-ed Details  pelvic floor contraction anally and vaginally making sure she has a full contraction and full relaxation; diaphragmatic breathing with pushing the therapist finger out of the vaginal canal with verbal cues to breath out and not hold her breath.      Manual Therapy   Manual Therapy Internal Pelvic Floor    Internal Pelvic Floor internal work to the pelvic floor muscles vaginally feeling for tissue restrictions and                       PT Short Term Goals - 12/18/21 1300       PT SHORT TERM GOAL #1   Title independent with initial HEP with flexibility exercises    Time 4    Period Weeks    Status Achieved      PT SHORT TERM GOAL #2   Title understand correct toileting technique to reduce strain on the pelvic floor    Time 4    Status Achieved      PT SHORT TERM GOAL #3   Title how to splint the pelvic floor to reduce rectocele  while bathrooming    Time 4    Period Weeks    Status Achieved      PT SHORT TERM GOAL #4   Title understand how to perform abdominal massage to promote improved bowel health and decrease strain on the pelvic floor    Time 4    Period Weeks    Status Achieved    Target Date 09/29/21               PT Long Term Goals - 02/14/22 0955       PT LONG TERM GOAL #1   Title independent with HEP and understand how to progress herself    Time 4    Period Months    Status Achieved    Target Date 12/30/21      PT LONG TERM GOAL #2   Title able to bulge the pelvis floor correctly to push the stool out and not strain    Time 4    Period Months    Status Achieved      PT LONG TERM GOAL #3   Title straining to have a bowel movement decreased >/= 75%    Time 4    Period Months    Status Achieved      PT LONG TERM GOAL #4   Title bilateral hip abduction >/= 4/5 to assist abdominals to contract  to have a bowel movement correctlu    Time 4    Period Months    Status Achieved                   Plan - 02/14/22 1004     Clinical Impression Statement Patient is splinting 50% of the time with a bowel movement for the initial part. She used to splint off and on during a bowel movement for each one. Patient strength vaginally is 4/5 and anally is 5/5. Patient is independent with HEP and understands how to progress herself. Patient is working with her  chiropractor on her left hip due to it going out at times.    Personal Factors and Comorbidities Comorbidity 3+;Past/Current Experience    Comorbidities anterior and posterior repair 04/15/2019; appendectomey; c-section    Examination-Activity Limitations Toileting    Stability/Clinical Decision Making Stable/Uncomplicated    Rehab Potential Excellent    PT Treatment/Interventions ADLs/Self Care Home Management;Biofeedback;Therapeutic activities;Therapeutic exercise;Neuromuscular re-education;Patient/family education;Manual  techniques;Dry needling;Taping;Joint Manipulations    PT Next Visit Plan Discharge to HEP    PT Home Exercise Plan Access Code: [email or web address]    Consulted and Agree with Plan of Care Patient             Patient will benefit from skilled therapeutic intervention in order to improve the following deficits and impairments:  Decreased coordination, Increased fascial restricitons, Decreased strength, Decreased activity tolerance  Visit Diagnosis: Other lack of coordination  Muscle weakness (generalized)     Problem List Patient Active Problem List   Diagnosis Date Noted   Dysuria 10/22/2017   Nonallopathic lesion of thoracic region 05/15/2017   Nonallopathic lesion of cervical region 05/15/2017   Nonallopathic lesion of sacral region 05/15/2017   Chronic neck pain 04/30/2017   Chronic back pain 04/30/2017   Arthralgia of hip 04/30/2017   Pelvic prolapse 04/30/2017   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   Eczema 09/07/2016   Urinary urgency 09/07/2016   Chronic sinusitis 09/29/2015   Allergic rhinitis 09/29/2015   Prediabetes 09/29/2015   Vitamin D deficiency 09/29/2015   Situational anxiety-flying 09/29/2015   ETD (Eustachian tube dysfunction), bilateral 09/29/2015    Earlie Counts, PT 02/14/22 10:07 AM  [facility] [place], [place], [address] Phone: [phone]   Fax:  [phone]  Name: Jamieka Royle MRN: [id number] Date of Birth: 13-Apr-1961  PHYSICAL THERAPY DISCHARGE SUMMARY  Visits from Start of Care: 12  Current functional level related to goals / functional outcomes: See above.    Remaining deficits: See above.    Education / Equipment: HEP   Patient agrees to discharge. Patient goals were met. Patient is being discharged due to meeting the stated rehab goals. Thank you for the referral. Earlie Counts, PT 02/14/22 10:08 AM

## 2022-02-20 ENCOUNTER — Other Ambulatory Visit: Payer: 59

## 2022-07-09 ENCOUNTER — Ambulatory Visit (INDEPENDENT_AMBULATORY_CARE_PROVIDER_SITE_OTHER): Payer: 59 | Admitting: Obstetrics and Gynecology

## 2022-07-09 ENCOUNTER — Encounter: Payer: Self-pay | Admitting: Obstetrics and Gynecology

## 2022-07-09 VITALS — BP 124/80 | HR 78 | Ht 63.0 in | Wt 172.0 lb

## 2022-07-09 DIAGNOSIS — Z01419 Encounter for gynecological examination (general) (routine) without abnormal findings: Secondary | ICD-10-CM

## 2022-07-09 DIAGNOSIS — Z87448 Personal history of other diseases of urinary system: Secondary | ICD-10-CM

## 2022-07-09 NOTE — Progress Notes (Signed)
61 y.o. [id number] [name] here for annual exam.    Pelvic floor therapy went well. Rare urgency to void.  Still splinting some to have BMs.   Denies vaginal bleeding.   States she has had recurrent cystoscopies due to blood in her urine.  She has more concentrated urine if she is not drinking a lot of water.   Asking about checking her urine for hematuria.  She has a history of this and has had evaluation with urology many years ago.   She has an elevate calcium.  She will see an endocrinologist.   PCP:  Amy Pace, FNP  Patient's last menstrual period was 10/26/2015 (within months).           Sexually active: Yes.    The current method of family planning is post menopausal status.    Exercising: No.   Doing pelvic floor exercises  Smoker:  no  Health Maintenance: Pap:  07/06/2021 - negative, neg HR HPV,  01-13-18 Neg:Neg HR HPV, 01/24/17 Negative per patient  History of abnormal Pap:  no MMG:  02/03/2022  BI-RADS CATEGORY  3: Probably benign. Has follow up scheduled for a 6 month recheck. Colonoscopy: 11/15/2021 polyps - due again in 1 year. BMD:  n/a TDaP:  n/a Gardasil:   no HIV:  neg in past Hep C:  neg in past Screening Labs: PCP Flu vaccine:  will do at [facility].  Covid vaccine:  discussed.    reports that she has never smoked. She has never used smokeless tobacco. She reports current alcohol use. She reports that she does not use drugs.  Past Medical History:  Diagnosis Date   Allergy    Anxiety    Arthritis    Colon polyp    Depression    Endometriosis    Fibroid    GERD (gastroesophageal reflux disease)    Thyroid disease     Past Surgical History:  Procedure Laterality Date   ANTERIOR AND POSTERIOR REPAIR  04/15/2019   pelvic floor   APPENDECTOMY     BREAST SURGERY     CESAREAN SECTION     COLONOSCOPY     ENDOMETRIAL BIOPSY  04/27/2020   benign   endomitriosis  1995   HYSTEROSCOPY WITH D & C  05/31/2020   benign   OOPHORECTOMY  Right 1996   SALPINGECTOMY Bilateral 1996    Current Outpatient Medications  Medication Sig Dispense Refill   B Complex Vitamins (VITAMIN B COMPLEX PO) Take by mouth.     cetirizine (ZYRTEC) 10 MG tablet Take 1 tablet (10 mg total) by mouth daily. 30 tablet 11   Cyanocobalamin (VITAMIN B-12 PO) Take 5,000 mg by mouth daily.     famotidine (PEPCID) 20 MG tablet Take 1 tablet (20 mg total) by mouth at bedtime. As needed 30 tablet 3   fluticasone (FLONASE) 50 MCG/ACT nasal spray Place 2 sprays into both nostrils daily.     VITAMIN D, ERGOCALCIFEROL, PO Take 1 capsule by mouth daily.     EPINEPHrine 0.3 mg/0.3 mL IJ SOAJ injection  (Patient not taking: Reported on 07/09/2022)     No current facility-administered medications for this visit.    Family History  Problem Relation Age of Onset   Hyperlipidemia Mother    Hypertension Mother    Diabetes Father    Prostate cancer Father    Arthritis Sister    Rheum arthritis Sister    Heart disease Brother    Heart attack Brother  Cancer Maternal Grandmother        breast   Colon cancer Neg Hx    Esophageal cancer Neg Hx    Rectal cancer Neg Hx    Stomach cancer Neg Hx     Review of Systems  All other systems reviewed and are negative.   Exam:   BP 124/80 (BP Location: Right Arm, Patient Position: Sitting, Cuff Size: Normal)   Pulse 78   Ht '5\' 3"'$  (1.6 m)   Wt 172 lb (78 kg)   LMP 10/26/2015 (Within Months)   BMI 30.47 kg/m     General appearance: alert, cooperative and appears stated age Head: normocephalic, without obvious abnormality, atraumatic Neck: no adenopathy, supple, symmetrical, trachea midline and thyroid normal to inspection and palpation Lungs: clear to auscultation bilaterally Breasts: normal appearance, no masses or tenderness, No nipple retraction or dimpling, No nipple discharge or bleeding, No axillary adenopathy Heart: regular rate and rhythm Abdomen: soft, non-tender; no masses, no  organomegaly Extremities: extremities normal, atraumatic, no cyanosis or edema Skin: skin color, texture, turgor normal. No rashes or lesions Lymph nodes: cervical, supraclavicular, and axillary nodes normal. Neurologic: grossly normal  Pelvic: External genitalia:  no lesions              No abnormal inguinal nodes palpated.              Urethra:  normal appearing urethra with no masses, tenderness or lesions              Bartholins and Skenes: normal                 Vagina: normal appearing vagina with normal color and discharge, no lesions.  First degree rectocele.               Cervix: no lesions              Pap taken: no Bimanual Exam:  Uterus:  normal size, contour, position, consistency, mobility, non-tender              Adnexa: no mass, fullness, tenderness              Rectal exam: yes.  Confirms.              Anus:  normal sphincter tone, no lesions  Chaperone was present for exam:  Kimalexis, CMA  Assessment:   Well woman visit with gynecologic exam. Status post right oophorectomy and bilateral salpingectomy. Status post anterior and posterior colporrhaphy.  First degree rectocele.  Chronic constipation.  Hx colon polyps.  Hx fibroids. Hx hematuria.  Elevated calcium.  She will see endocrinology.  Plan: Mammogram screening discussed. Self breast awareness reviewed. Pap and HR HPV as above. Guidelines for Calcium, Vitamin D, regular exercise program including cardiovascular and weight bearing exercise. Urinalysis and reflex culture.  Follow up annually and prn.   After visit summary provided.

## 2022-07-09 NOTE — Patient Instructions (Signed)

## 2022-07-11 LAB — URINALYSIS, COMPLETE W/RFL CULTURE
Bacteria, UA: NONE SEEN /HPF
Bilirubin Urine: NEGATIVE
Glucose, UA: NEGATIVE
Hyaline Cast: NONE SEEN /LPF
Ketones, ur: NEGATIVE
Leukocyte Esterase: NEGATIVE
Nitrites, Initial: NEGATIVE
Protein, ur: NEGATIVE
Specific Gravity, Urine: 1.01 (ref 1.001–1.035)
WBC, UA: NONE SEEN /HPF (ref 0–5)
pH: 7 (ref 5.0–8.0)

## 2022-07-11 LAB — URINE CULTURE
MICRO NUMBER:: 14180331
Result:: NO GROWTH
SPECIMEN QUALITY:: ADEQUATE

## 2022-07-11 LAB — CULTURE INDICATED

## 2022-07-12 ENCOUNTER — Encounter: Payer: Self-pay | Admitting: Obstetrics and Gynecology

## 2022-09-03 ENCOUNTER — Ambulatory Visit
Admission: RE | Admit: 2022-09-03 | Discharge: 2022-09-03 | Disposition: A | Discharge status: Home / Self Care | Payer: 59 | Source: Ambulatory Visit | Attending: Obstetrics and Gynecology | Admitting: Obstetrics and Gynecology

## 2022-09-03 DIAGNOSIS — N6320 Unspecified lump in the left breast, unspecified quadrant: Secondary | ICD-10-CM

## 2022-09-07 ENCOUNTER — Encounter: Payer: Self-pay | Admitting: Gastroenterology

## 2022-09-11 ENCOUNTER — Other Ambulatory Visit: Payer: Self-pay | Admitting: Obstetrics and Gynecology

## 2022-09-11 DIAGNOSIS — N6320 Unspecified lump in the left breast, unspecified quadrant: Secondary | ICD-10-CM

## 2022-10-11 ENCOUNTER — Other Ambulatory Visit: Payer: 59

## 2022-10-11 DIAGNOSIS — Z87448 Personal history of other diseases of urinary system: Secondary | ICD-10-CM

## 2022-11-07 ENCOUNTER — Ambulatory Visit (AMBULATORY_SURGERY_CENTER): Payer: 59 | Admitting: *Deleted

## 2022-11-07 VITALS — Ht 64.0 in | Wt 165.0 lb

## 2022-11-07 DIAGNOSIS — Z86010 Personal history of colonic polyps: Secondary | ICD-10-CM

## 2022-11-07 NOTE — Progress Notes (Signed)
No egg or soy allergy known to patient  No issues known to pt with past sedation with any surgeries or procedures Patient denies ever being told they had issues or difficulty with intubation  No FH of Malignant Hyperthermia Pt is not on diet pills Pt is not on  home 02  Pt is not on blood thinners  Pt denies issues with constipation  Pt is not on dialysis Pt denies any upcoming cardiac testing Pt encouraged to use to use Singlecare or Goodrx to reduce cost  Patient's chart reviewed by Osvaldo Angst CNRA prior to previsit and patient appropriate for the [place].  Previsit completed and red dot placed by patient's name on their procedure day (on provider's schedule).  . Visit by phone Pt states weight is : 165 lb Instructions reviewed with pt and pt states understanding. Instructed to review again prior to procedure. Pt states they will.  Instructions sent by mail with coupon and my chart

## 2022-11-15 ENCOUNTER — Telehealth: Payer: Self-pay | Admitting: Gastroenterology

## 2022-11-15 NOTE — Telephone Encounter (Signed)
Inbound call from patient, she wishes to reschedule her procedure scheduled for 3/27. Patient was rescheduled for 5/9. Please place new ambulatory referral.

## 2022-11-21 ENCOUNTER — Encounter: Payer: Self-pay | Admitting: Gastroenterology

## 2022-11-26 DIAGNOSIS — Z419 Encounter for procedure for purposes other than remedying health state, unspecified: Secondary | ICD-10-CM | POA: Diagnosis not present

## 2022-12-17 ENCOUNTER — Telehealth: Payer: Self-pay | Admitting: *Deleted

## 2022-12-17 NOTE — Telephone Encounter (Signed)
Patient came in for lab appt on 10/11/22 for repeat UA for hematuria. No results in EPIC.   Reviewed with lab. No urine collected 10/11/22.   Call returned to patient. Advised as seen above, patient agreeable to return for repeat UA. Scheduled lab appt for 4/23 at 10am. Order previously placed.  Patient aware of turn around for UA and culture, if needed. Questions answered.   Routing to Dr. Edward Jolly.  Encounter closed.

## 2022-12-18 ENCOUNTER — Other Ambulatory Visit: Payer: BC Managed Care – PPO

## 2022-12-18 DIAGNOSIS — Z87448 Personal history of other diseases of urinary system: Secondary | ICD-10-CM | POA: Diagnosis not present

## 2022-12-18 DIAGNOSIS — R3915 Urgency of urination: Secondary | ICD-10-CM | POA: Diagnosis not present

## 2022-12-18 DIAGNOSIS — R300 Dysuria: Secondary | ICD-10-CM | POA: Diagnosis not present

## 2022-12-18 DIAGNOSIS — R319 Hematuria, unspecified: Secondary | ICD-10-CM | POA: Diagnosis not present

## 2022-12-20 LAB — URINALYSIS, COMPLETE W/RFL CULTURE
Bacteria, UA: NONE SEEN /HPF
Bilirubin Urine: NEGATIVE
Glucose, UA: NEGATIVE
Hyaline Cast: NONE SEEN /LPF
Ketones, ur: NEGATIVE
Nitrites, Initial: NEGATIVE
Protein, ur: NEGATIVE
RBC / HPF: NONE SEEN /HPF (ref 0–2)
Specific Gravity, Urine: 1.01 (ref 1.001–1.035)
pH: 6.5 (ref 5.0–8.0)

## 2022-12-20 LAB — URINE CULTURE
MICRO NUMBER:: 14861658
SPECIMEN QUALITY:: ADEQUATE

## 2022-12-20 LAB — CULTURE INDICATED

## 2022-12-25 ENCOUNTER — Encounter: Payer: Self-pay | Admitting: Gastroenterology

## 2022-12-26 DIAGNOSIS — Z419 Encounter for procedure for purposes other than remedying health state, unspecified: Secondary | ICD-10-CM | POA: Diagnosis not present

## 2022-12-30 ENCOUNTER — Encounter: Payer: Self-pay | Admitting: Certified Registered Nurse Anesthetist

## 2023-01-03 ENCOUNTER — Encounter: Payer: Self-pay | Admitting: Gastroenterology

## 2023-01-03 ENCOUNTER — Ambulatory Visit (AMBULATORY_SURGERY_CENTER): Payer: BC Managed Care – PPO | Admitting: Gastroenterology

## 2023-01-03 VITALS — BP 100/64 | HR 68 | Temp 98.4°F | Resp 12 | Ht 64.0 in | Wt 165.0 lb

## 2023-01-03 DIAGNOSIS — Z86010 Personal history of colonic polyps: Secondary | ICD-10-CM

## 2023-01-03 DIAGNOSIS — Z09 Encounter for follow-up examination after completed treatment for conditions other than malignant neoplasm: Secondary | ICD-10-CM | POA: Diagnosis not present

## 2023-01-03 DIAGNOSIS — D122 Benign neoplasm of ascending colon: Secondary | ICD-10-CM

## 2023-01-03 DIAGNOSIS — K635 Polyp of colon: Secondary | ICD-10-CM | POA: Diagnosis not present

## 2023-01-03 DIAGNOSIS — Z1211 Encounter for screening for malignant neoplasm of colon: Secondary | ICD-10-CM | POA: Diagnosis not present

## 2023-01-03 MED ORDER — SODIUM CHLORIDE 0.9 % IV SOLN
500.0000 mL | Freq: Once | INTRAVENOUS | Status: DC
Start: 2023-01-03 — End: 2023-01-03

## 2023-01-03 NOTE — Progress Notes (Signed)
Report given to PACU, vss 

## 2023-01-03 NOTE — Progress Notes (Signed)
Gastroenterology History and Physical   Primary Care Physician:  Trisha Mangle, FNP   Reason for Procedure:  History of adenomatous colon polyps  Plan:    Surveillance colonoscopy with possible interventions as needed     HPI: Amy Burton is a very pleasant 62 y.o. female here for surveillance colonoscopy. Denies any nausea, vomiting, abdominal pain, melena or bright red blood per rectum  The risks and benefits as well as alternatives of endoscopic procedure(s) have been discussed and reviewed. All questions answered. The patient agrees to proceed.    Past Medical History:  Diagnosis Date   Allergy    Anxiety    Arthritis    Colon polyp    Depression    Endometriosis    Fibroid    GERD (gastroesophageal reflux disease)    Microscopic hematuria    Thyroid disease     Past Surgical History:  Procedure Laterality Date   ANTERIOR AND POSTERIOR REPAIR  04/15/2019   pelvic floor   APPENDECTOMY     BREAST SURGERY     CESAREAN SECTION     COLONOSCOPY     ENDOMETRIAL BIOPSY  04/27/2020   benign   endomitriosis  1995   HYSTEROSCOPY WITH D & C  05/31/2020   benign   OOPHORECTOMY Right 1996   SALPINGECTOMY Bilateral 1996    Prior to Admission medications   Medication Sig Start Date End Date Taking? Authorizing Provider  B Complex Vitamins (VITAMIN B COMPLEX PO) Take by mouth.    [provider]  cetirizine (ZYRTEC) 10 MG tablet Take 1 tablet (10 mg total) by mouth daily. 09/07/16   Pincus Sanes, MD  fluticasone (FLONASE) 50 MCG/ACT nasal spray Place 2 sprays into both nostrils daily. 07/29/21   [provider]    Current Outpatient Medications  Medication Sig Dispense Refill   B Complex Vitamins (VITAMIN B COMPLEX PO) Take by mouth.     cetirizine (ZYRTEC) 10 MG tablet Take 1 tablet (10 mg total) by mouth daily. 30 tablet 11   fluticasone (FLONASE) 50 MCG/ACT nasal spray Place 2 sprays into both nostrils daily.     Current  Facility-Administered Medications  Medication Dose Route Frequency Provider Last Rate Last Admin   0.9 %  sodium chloride infusion  500 mL Intravenous Once Napoleon Form, MD        Allergies as of 01/03/2023 - Review Complete 01/03/2023  Allergen Reaction Noted   Sulfa antibiotics Rash 09/29/2015    Family History  Problem Relation Age of Onset   Hyperlipidemia Mother    Hypertension Mother    Diabetes Father    Prostate cancer Father    Arthritis Sister    Rheum arthritis Sister    Heart disease Brother    Heart attack Brother    Cancer Maternal Grandmother        breast   Colon cancer Neg Hx    Esophageal cancer Neg Hx    Rectal cancer Neg Hx    Stomach cancer Neg Hx    Colon polyps Neg Hx     Social History   Socioeconomic History   Marital status: Married    Spouse name: Not on file   Number of children: 3   Years of education: Not on file   Highest education level: Not on file  Occupational History   Not on file  Tobacco Use   Smoking status: Never   Smokeless tobacco: Never  Vaping Use   Vaping Use:  Never used  Substance and Sexual Activity   Alcohol use: Not Currently    Comment: 1 glass of wine per month   Drug use: No   Sexual activity: Yes    Birth control/protection: Surgical    Comment: Salpingectomy/Oophorectomy   Other Topics Concern   Not on file  Social History Narrative   Not on file   Social Determinants of Health   Financial Resource Strain: Not on file  Food Insecurity: Not on file  Transportation Needs: Not on file  Physical Activity: Not on file  Stress: Not on file  Social Connections: Not on file  Intimate Partner Violence: Not on file    Review of Systems:  All other review of systems negative except as mentioned in the HPI.  Physical Exam: Vital signs in last 24 hours: Blood Pressure (Abnormal) 146/85   Pulse (Abnormal) 133   Temperature 98.4 F (36.9 C)   Respiration 15   Height 5\' 4"  (1.626 m)   Weight  165 lb (74.8 kg)   Last Menstrual Period 10/26/2015 (Within Months)   Oxygen Saturation 100%   Body Mass Index 28.32 kg/m  General:   Alert, NAD Lungs:  Clear .   Heart:  Regular rate and rhythm Abdomen:  Soft, nontender and nondistended. Neuro/Psych:  Alert and cooperative. Normal mood and affect. A and O x 3  Reviewed labs, radiology imaging, old records and pertinent past GI work up  Patient is appropriate for planned procedure(s) and anesthesia in an ambulatory setting   K. Scherry Ran , MD [phone]

## 2023-01-03 NOTE — Progress Notes (Signed)
Called to room to assist during endoscopic procedure.  Patient ID and intended procedure confirmed with present staff. Received instructions for my participation in the procedure from the performing physician.  

## 2023-01-03 NOTE — Op Note (Signed)
[facility] Patient Name: Amy Burton Procedure Date: 01/03/2023 9:04 AM MRN: [id number] Endoscopist: Napoleon Form , MD, [id number] Age: 62 Referring MD:  Date of Birth: 1961/03/05 Gender: Female Account #: [id number] Procedure:                Colonoscopy Indications:              Surveillance: Piecemeal removal of large sessile                            adenoma last colonoscopy (< 3 yrs) Medicines:                Monitored Anesthesia Care Procedure:                Pre-Anesthesia Assessment:                           - Prior to the procedure, a History and Physical                            was performed, and patient medications and                            allergies were reviewed. The patient's tolerance of                            previous anesthesia was also reviewed. The risks                            and benefits of the procedure and the sedation                            options and risks were discussed with the patient.                            All questions were answered, and informed consent                            was obtained. Prior Anticoagulants: The patient has                            taken no anticoagulant or antiplatelet agents. ASA                            Grade Assessment: II - A patient with mild systemic                            disease. After reviewing the risks and benefits,                            the patient was deemed in satisfactory condition to                            undergo the procedure.  After obtaining informed consent, the colonoscope                            was passed under direct vision. Throughout the                            procedure, the patient's blood pressure, pulse, and                            oxygen saturations were monitored continuously. The                            [id number]-HQ190L Colonoscope [id number] was introduced                            through the anus  and advanced to the the cecum,                            identified by appendiceal orifice and ileocecal                            valve. The colonoscopy was performed without                            difficulty. The patient tolerated the procedure                            well. The quality of the bowel preparation was                            good. The ileocecal valve, appendiceal orifice, and                            rectum were photographed. Scope In: 9:15:08 AM Scope Out: 9:36:12 AM Scope Withdrawal Time: 0 hours 16 minutes 34 seconds  Total Procedure Duration: 0 hours 21 minutes 4 seconds  Findings:                 The perianal and digital rectal examinations were                            normal.                           A less than 5 mm post polypectomy scar was found in                            the ascending colon. There was residual polypoid                            tissue. The polyp was removed with a cold snare.                            Resection and retrieval were complete.  Non-bleeding external and internal hemorrhoids were                            found during retroflexion. The hemorrhoids were                            medium-sized.                           The exam was otherwise without abnormality. Complications:            No immediate complications. Estimated Blood Loss:     Estimated blood loss was minimal. Impression:               - Post-polypectomy scar in the ascending colon.                           - Non-bleeding external and internal hemorrhoids.                           - The examination was otherwise normal. Recommendation:           - Patient has a contact number available for                            emergencies. The signs and symptoms of potential                            delayed complications were discussed with the                            patient. Return to normal activities tomorrow.                             Written discharge instructions were provided to the                            patient.                           - Resume previous diet.                           - Continue present medications.                           - Await pathology results.                           - Repeat colonoscopy in 3 years for surveillance                            based on pathology results. Napoleon Form, MD 01/03/2023 9:42:13 AM This report has been signed electronically.

## 2023-01-04 ENCOUNTER — Telehealth: Payer: Self-pay | Admitting: *Deleted

## 2023-01-04 NOTE — Telephone Encounter (Signed)
  Follow up Call-     01/03/2023    8:21 AM 11/15/2021    9:52 AM  Call back number  Post procedure Call Back phone  # [phone]  Permission to leave phone message Yes Yes     Patient questions:  Do you have a fever, pain , or abdominal swelling? No. Pain Score  0 *  Have you tolerated food without any problems? Yes.    Have you been able to return to your normal activities? Yes.    Do you have any questions about your discharge instructions: Diet   No. Medications  No. Follow up visit  No.  Do you have questions or concerns about your Care? No.  Actions: * If pain score is 4 or above: No action needed, pain <4.

## 2023-01-09 ENCOUNTER — Encounter: Payer: Self-pay | Admitting: Gastroenterology

## 2023-01-26 DIAGNOSIS — Z419 Encounter for procedure for purposes other than remedying health state, unspecified: Secondary | ICD-10-CM | POA: Diagnosis not present

## 2023-02-25 DIAGNOSIS — Z419 Encounter for procedure for purposes other than remedying health state, unspecified: Secondary | ICD-10-CM | POA: Diagnosis not present

## 2023-03-05 ENCOUNTER — Ambulatory Visit
Admission: RE | Admit: 2023-03-05 | Discharge: 2023-03-05 | Disposition: A | Discharge status: Home / Self Care | Payer: 59 | Source: Ambulatory Visit | Attending: Obstetrics and Gynecology | Admitting: Obstetrics and Gynecology

## 2023-03-05 ENCOUNTER — Ambulatory Visit
Admission: RE | Admit: 2023-03-05 | Discharge: 2023-03-05 | Disposition: A | Discharge status: Home / Self Care | Payer: BC Managed Care – PPO | Source: Ambulatory Visit | Attending: Obstetrics and Gynecology | Admitting: Obstetrics and Gynecology

## 2023-03-05 DIAGNOSIS — N6323 Unspecified lump in the left breast, lower outer quadrant: Secondary | ICD-10-CM | POA: Diagnosis not present

## 2023-03-05 DIAGNOSIS — N6320 Unspecified lump in the left breast, unspecified quadrant: Secondary | ICD-10-CM

## 2023-03-28 DIAGNOSIS — Z419 Encounter for procedure for purposes other than remedying health state, unspecified: Secondary | ICD-10-CM | POA: Diagnosis not present

## 2023-04-11 DIAGNOSIS — L218 Other seborrheic dermatitis: Secondary | ICD-10-CM | POA: Diagnosis not present

## 2023-04-11 DIAGNOSIS — D1801 Hemangioma of skin and subcutaneous tissue: Secondary | ICD-10-CM | POA: Diagnosis not present

## 2023-04-28 DIAGNOSIS — Z419 Encounter for procedure for purposes other than remedying health state, unspecified: Secondary | ICD-10-CM | POA: Diagnosis not present

## 2023-05-29 ENCOUNTER — Encounter: Payer: Self-pay | Admitting: Obstetrics and Gynecology

## 2023-05-30 NOTE — Telephone Encounter (Signed)
Msg sent to appt desk.  

## 2023-06-07 NOTE — Telephone Encounter (Signed)
Spoke with patient. Patient denies fever/chills, redness or nipple drainage.   OV scheduled for 06/11/23 at 0915 with Dr. Edward Jolly.   Routing to provider for final review. Patient is agreeable to disposition. Will close encounter.

## 2023-06-07 NOTE — Telephone Encounter (Signed)
 Left message to call [facility] Triage at [phone], option 4.

## 2023-06-11 ENCOUNTER — Encounter: Payer: Self-pay | Admitting: Obstetrics and Gynecology

## 2023-06-11 ENCOUNTER — Ambulatory Visit (INDEPENDENT_AMBULATORY_CARE_PROVIDER_SITE_OTHER): Payer: BC Managed Care – PPO | Admitting: Obstetrics and Gynecology

## 2023-06-11 ENCOUNTER — Telehealth: Payer: Self-pay | Admitting: Obstetrics and Gynecology

## 2023-06-11 VITALS — BP 124/84 | HR 78 | Ht 64.0 in | Wt 162.0 lb

## 2023-06-11 DIAGNOSIS — N644 Mastodynia: Secondary | ICD-10-CM

## 2023-06-11 DIAGNOSIS — N6315 Unspecified lump in the right breast, overlapping quadrants: Secondary | ICD-10-CM

## 2023-06-11 NOTE — Progress Notes (Signed)
GYNECOLOGY  VISIT   HPI: 62 y.o.   Married  Caucasian  female   [phone] with Patient's last menstrual period was 10/26/2015 (within months).   here for   R breast lump- Friday and Saturday pt reported the lump was bigger and painful. Pt states now this feels like it less large and has changed shape.  She noted the lump one month ago.  It enlarged and became really sore.  Now it is more circular.  No breast discharge.   Dx bilateral mammogram and left breast US on 03/05/23 showed stable left breast mass.   Stopped caffeine use and her breasts feel worse.   She had left breast surgery due to papillomas.   No HRT.  Maternal GM had breast cancer.   Going through a divorce.  GYNECOLOGIC HISTORY: Patient's last menstrual period was 10/26/2015 (within months). Contraception:  PMP Menopausal hormone therapy:  n/a Last mammogram:  03/05/23 Breast Density Cat B, BI-RADS CATEGORY  3: Probably benig  Last pap smear:   07/06/2021 - negative, neg HR HPV,  01-13-18 Neg:Neg HR HPV, 01/24/17 Negative per patient         OB History     Gravida  5   Para  3   Term  0   Preterm  0   AB  1   Living  3      SAB  1   IAB  0   Ectopic  0   Multiple  0   Live Births  0              Patient Active Problem List   Diagnosis Date Noted   Dysuria 10/22/2017   Nonallopathic lesion of thoracic region 05/15/2017   Nonallopathic lesion of cervical region 05/15/2017   Nonallopathic lesion of sacral region 05/15/2017   Chronic neck pain 04/30/2017   Chronic back pain 04/30/2017   Arthralgia of hip 04/30/2017   Pelvic prolapse 04/30/2017   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   Eczema 09/07/2016   Urinary urgency 09/07/2016   Chronic sinusitis 09/29/2015   Allergic rhinitis 09/29/2015   Prediabetes 09/29/2015   Vitamin D deficiency 09/29/2015   Situational anxiety-flying 09/29/2015   ETD (Eustachian tube dysfunction), bilateral 09/29/2015    Past Medical History:  Diagnosis  Date   Allergy    Anxiety    Arthritis    Colon polyp    Depression    Endometriosis    Fibroid    GERD (gastroesophageal reflux disease)    Microscopic hematuria    Thyroid disease     Past Surgical History:  Procedure Laterality Date   ANTERIOR AND POSTERIOR REPAIR  04/15/2019   pelvic floor   APPENDECTOMY     BREAST SURGERY     CESAREAN SECTION     COLONOSCOPY     ENDOMETRIAL BIOPSY  04/27/2020   benign   endomitriosis  1995   HYSTEROSCOPY WITH D & C  05/31/2020   benign   OOPHORECTOMY Right 1996   SALPINGECTOMY Bilateral 1996    Current Outpatient Medications  Medication Sig Dispense Refill   B Complex Vitamins (VITAMIN B COMPLEX PO) Take by mouth.     cetirizine (ZYRTEC) 10 MG tablet Take 1 tablet (10 mg total) by mouth daily. 30 tablet 11   fluticasone (FLONASE) 50 MCG/ACT nasal spray Place 2 sprays into both nostrils daily.     No current facility-administered medications for this visit.     ALLERGIES: Sulfa antibiotics  Family History  Problem Relation Age of Onset   Hyperlipidemia Mother    Hypertension Mother    Diabetes Father    Prostate cancer Father    Arthritis Sister    Rheum arthritis Sister    Heart disease Brother    Heart attack Brother    Cancer Maternal Grandmother        breast   Colon cancer Neg Hx    Esophageal cancer Neg Hx    Rectal cancer Neg Hx    Stomach cancer Neg Hx    Colon polyps Neg Hx     Social History   Socioeconomic History   Marital status: Married    Spouse name: Not on file   Number of children: 3   Years of education: Not on file   Highest education level: Not on file  Occupational History   Not on file  Tobacco Use   Smoking status: Never   Smokeless tobacco: Never  Vaping Use   Vaping status: Never Used  Substance and Sexual Activity   Alcohol use: Not Currently    Comment: 1 glass of wine per month   Drug use: No   Sexual activity: Not Currently    Birth control/protection: Surgical     Comment: Salpingectomy/Oophorectomy   Other Topics Concern   Not on file  Social History Narrative   Not on file   Social Determinants of Health   Financial Resource Strain: Not on file  Food Insecurity: Not on file  Transportation Needs: Not on file  Physical Activity: Not on file  Stress: Not on file  Social Connections: Not on file  Intimate Partner Violence: Not on file    Review of Systems  All other systems reviewed and are negative.   PHYSICAL EXAMINATION:    BP 124/84 (BP Location: Right Arm, Patient Position: Sitting, Cuff Size: Normal)   Pulse 78   Ht 5\' 4"  (1.626 m)   Wt 162 lb (73.5 kg)   LMP 10/26/2015 (Within Months)   SpO2 98%   BMI 27.81 kg/m     General appearance: alert, cooperative and appears stated age   Breasts: left - scar around left nipple, no masses or tenderness, No nipple retraction or dimpling, No nipple discharge or bleeding, No axillary or supraclavicular adenopathy Right -  normal appearance, 4 cm mass at 12:00, no tenderness, No nipple retraction or dimpling, No nipple discharge or bleeding, No axillary or supraclavicular adenopathy  Chaperone was present for exam:  Warren Lacy, CMA  ASSESSMENT  Right breast mass.  Bilateral breast discomfort.    PLAN  Dx right mammogram and right breast US.  We discussed ibuprofen, evening primrose, oral vit E, and bra change to treat pain.  FU prn.

## 2023-06-11 NOTE — Telephone Encounter (Signed)
Spoke w/ Destiny @ [facility] and got pt scheduled for 06/21/2023 @ 950.   Pt notified and voiced understanding. Routing to provider for final review and closing encounter.

## 2023-06-11 NOTE — Telephone Encounter (Signed)
[facility] schedule [facility].   My patient has a 4 cm mass at about 12:00.

## 2023-06-21 ENCOUNTER — Ambulatory Visit
Admission: RE | Admit: 2023-06-21 | Discharge: 2023-06-21 | Disposition: A | Discharge status: Home / Self Care | Payer: BC Managed Care – PPO | Source: Ambulatory Visit | Attending: Obstetrics and Gynecology | Admitting: Obstetrics and Gynecology

## 2023-06-21 DIAGNOSIS — N6001 Solitary cyst of right breast: Secondary | ICD-10-CM | POA: Diagnosis not present

## 2023-06-21 DIAGNOSIS — N644 Mastodynia: Secondary | ICD-10-CM

## 2023-06-21 DIAGNOSIS — N6315 Unspecified lump in the right breast, overlapping quadrants: Secondary | ICD-10-CM

## 2023-06-21 DIAGNOSIS — N6311 Unspecified lump in the right breast, upper outer quadrant: Secondary | ICD-10-CM | POA: Diagnosis not present

## 2023-06-28 DIAGNOSIS — Z419 Encounter for procedure for purposes other than remedying health state, unspecified: Secondary | ICD-10-CM | POA: Diagnosis not present

## 2023-07-23 NOTE — Progress Notes (Unsigned)
62 y.o. G21P0013 [name] here for annual exam.    PCP: Trisha Mangle, FNP   Patient's last menstrual period was 10/26/2015 (within months).           Sexually active: No.  The current method of family planning is post menopausal status.    Menopausal hormone therapy:  n/a Exercising: {yes no:314532}  {types:19826} Smoker:  no  OB History  Gravida Para Term Preterm AB Living  5 3 0 0 1 3  SAB IAB Ectopic Multiple Live Births  1 0 0 0 0    # Outcome Date GA Lbr Len/2nd Weight Sex Type Anes PTL Lv  5 Gravida           4 SAB           3 Para     M Vag-Spont     2 Para     F Vag-Spont     1 Para     M CS-Unspec        HEALTH MAINTENANCE: Last 2 paps:  07/06/2021 - negative, neg HR HPV,  01-13-18 Neg:Neg HR HPV, 01/24/17 Negative per patient  History of abnormal Pap or positive HPV:  no Mammogram:   06/21/23 Breast Density Cat B, BI-RADS CAT 2 benign Colonoscopy:  01/03/23 Bone Density:  n/a  Result  n/a    There is no immunization history on file for this patient.    reports that she has never smoked. She has never used smokeless tobacco. She reports that she does not currently use alcohol. She reports that she does not use drugs.  Past Medical History:  Diagnosis Date   Allergy    Anxiety    Arthritis    Colon polyp    Depression    Endometriosis    Fibroid    GERD (gastroesophageal reflux disease)    Microscopic hematuria    Thyroid disease     Past Surgical History:  Procedure Laterality Date   ANTERIOR AND POSTERIOR REPAIR  04/15/2019   pelvic floor   APPENDECTOMY     BREAST SURGERY     Left-Due to papillomas   CESAREAN SECTION     COLONOSCOPY     ENDOMETRIAL BIOPSY  04/27/2020   benign   endomitriosis  1995   HYSTEROSCOPY WITH D & C  05/31/2020   benign   OOPHORECTOMY Right 1996   SALPINGECTOMY Bilateral 1996    Current Outpatient Medications  Medication Sig Dispense Refill   B Complex Vitamins (VITAMIN B COMPLEX PO)  Take by mouth.     cetirizine (ZYRTEC) 10 MG tablet Take 1 tablet (10 mg total) by mouth daily. 30 tablet 11   fluticasone (FLONASE) 50 MCG/ACT nasal spray Place 2 sprays into both nostrils daily.     No current facility-administered medications for this visit.    ALLERGIES: Sulfa antibiotics  Family History  Problem Relation Age of Onset   Hyperlipidemia Mother    Hypertension Mother    Diabetes Father    Prostate cancer Father    Arthritis Sister    Rheum arthritis Sister    Heart disease Brother    Heart attack Brother    Cancer Maternal Grandmother        breast   Colon cancer Neg Hx    Esophageal cancer Neg Hx    Rectal cancer Neg Hx    Stomach cancer Neg Hx    Colon polyps Neg Hx     Review of Systems  PHYSICAL EXAM:  LMP 10/26/2015 (Within Months)     General appearance: alert, cooperative and appears stated age Head: normocephalic, without obvious abnormality, atraumatic Neck: no adenopathy, supple, symmetrical, trachea midline and thyroid normal to inspection and palpation Lungs: clear to auscultation bilaterally Breasts: normal appearance, no masses or tenderness, No nipple retraction or dimpling, No nipple discharge or bleeding, No axillary adenopathy Heart: regular rate and rhythm Abdomen: soft, non-tender; no masses, no organomegaly Extremities: extremities normal, atraumatic, no cyanosis or edema Skin: skin color, texture, turgor normal. No rashes or lesions Lymph nodes: cervical, supraclavicular, and axillary nodes normal. Neurologic: grossly normal  Pelvic: External genitalia:  no lesions              No abnormal inguinal nodes palpated.              Urethra:  normal appearing urethra with no masses, tenderness or lesions              Bartholins and Skenes: normal                 Vagina: normal appearing vagina with normal color and discharge, no lesions              Cervix: no lesions              Pap taken: {yes no:314532} Bimanual Exam:  Uterus:   normal size, contour, position, consistency, mobility, non-tender              Adnexa: no mass, fullness, tenderness              Rectal exam: {yes no:314532}.  Confirms.              Anus:  normal sphincter tone, no lesions  Chaperone was present for exam:  {BSCHAPERONE:31226::"Bridgitt Raggio F, CMA"}  ASSESSMENT: Well woman visit with gynecologic exam  ***  PLAN: Mammogram screening discussed. Self breast awareness reviewed. Pap and HRV collected:  {yes no:314532} Guidelines for Calcium, Vitamin D, regular exercise program including cardiovascular and weight bearing exercise. Medication refills:  *** {LABS (Optional):23779} Follow up:  ***    Additional counseling given.  {yes [id number]. ***  total time was spent for this patient encounter, including preparation, face-to-face counseling with the patient, coordination of care, and documentation of the encounter in addition to doing the well woman visit with gynecologic exam.

## 2023-07-28 DIAGNOSIS — Z419 Encounter for procedure for purposes other than remedying health state, unspecified: Secondary | ICD-10-CM | POA: Diagnosis not present

## 2023-08-06 ENCOUNTER — Ambulatory Visit: Payer: BC Managed Care – PPO | Admitting: Obstetrics and Gynecology

## 2023-08-07 DIAGNOSIS — H9202 Otalgia, left ear: Secondary | ICD-10-CM | POA: Diagnosis not present

## 2023-08-07 DIAGNOSIS — J029 Acute pharyngitis, unspecified: Secondary | ICD-10-CM | POA: Diagnosis not present

## 2023-08-14 ENCOUNTER — Ambulatory Visit: Payer: BC Managed Care – PPO | Admitting: Family Medicine

## 2023-08-28 DIAGNOSIS — Z419 Encounter for procedure for purposes other than remedying health state, unspecified: Secondary | ICD-10-CM | POA: Diagnosis not present

## 2023-09-16 ENCOUNTER — Ambulatory Visit: Payer: BC Managed Care – PPO | Admitting: Family Medicine

## 2023-09-28 DIAGNOSIS — Z419 Encounter for procedure for purposes other than remedying health state, unspecified: Secondary | ICD-10-CM | POA: Diagnosis not present

## 2023-10-02 DIAGNOSIS — H18513 Endothelial corneal dystrophy, bilateral: Secondary | ICD-10-CM | POA: Diagnosis not present

## 2023-10-22 DIAGNOSIS — R7303 Prediabetes: Secondary | ICD-10-CM | POA: Diagnosis not present

## 2023-10-22 DIAGNOSIS — E559 Vitamin D deficiency, unspecified: Secondary | ICD-10-CM | POA: Diagnosis not present

## 2023-10-22 DIAGNOSIS — Z0000 Encounter for general adult medical examination without abnormal findings: Secondary | ICD-10-CM | POA: Diagnosis not present

## 2023-10-25 DIAGNOSIS — E559 Vitamin D deficiency, unspecified: Secondary | ICD-10-CM | POA: Diagnosis not present

## 2023-10-25 DIAGNOSIS — E782 Mixed hyperlipidemia: Secondary | ICD-10-CM | POA: Diagnosis not present

## 2023-10-25 DIAGNOSIS — Z8249 Family history of ischemic heart disease and other diseases of the circulatory system: Secondary | ICD-10-CM | POA: Diagnosis not present

## 2023-10-25 DIAGNOSIS — Z0000 Encounter for general adult medical examination without abnormal findings: Secondary | ICD-10-CM | POA: Diagnosis not present

## 2023-10-25 DIAGNOSIS — R7303 Prediabetes: Secondary | ICD-10-CM | POA: Diagnosis not present

## 2023-10-26 DIAGNOSIS — Z419 Encounter for procedure for purposes other than remedying health state, unspecified: Secondary | ICD-10-CM | POA: Diagnosis not present

## 2023-11-19 ENCOUNTER — Ambulatory Visit: Payer: BC Managed Care – PPO | Admitting: Obstetrics and Gynecology

## 2023-12-07 DIAGNOSIS — Z419 Encounter for procedure for purposes other than remedying health state, unspecified: Secondary | ICD-10-CM | POA: Diagnosis not present

## 2024-01-01 ENCOUNTER — Encounter: Payer: Self-pay | Admitting: Obstetrics and Gynecology

## 2024-01-01 NOTE — Progress Notes (Signed)
 63 y.o. G16P0013 [name] here for annual exam.  Would like to discuss surgical options for breast and possibly a new referral for a pelvic floor therapist.   Has a soreness of her right breast.  This is chronic.  She has a hx of a cyst 2.9 cm noted on breast US  05/2523.  Also due to have dx left mammogram and US  in July, 2025.   Considering breast reduction.   Has neck pain.   Has some vaginal discomfort. Makes her think her pelvic floor may have dropped again. Hx anterior and posterior colporrhaphy. Some urge to void more often.  Has chronic constipation.  No bulge.  Not sexually active.   Has left groin pain that comes and goes.  Used a cold pack for treatment.  Does not feel a bulge.   PCP: Joenathan Muslim, FNP   Patient's last menstrual period was 10/26/2015 (within months).           Sexually active: No.  The current method of family planning is salpingectomy/postmenopausal.   Menopausal hormone therapy:  n/a Exercising: Yes.    Stretching/ Walking  Smoker:  no  OB History  Gravida Para Term Preterm AB Living  5 3 0 0 1 3  SAB IAB Ectopic Multiple Live Births  1 0 0 0 0    # Outcome Date GA Lbr Len/2nd Weight Sex Type Anes PTL Lv  5 Gravida           4 SAB           3 Para     M Vag-Spont     2 Para     F Vag-Spont     1 Para     M CS-Unspec        HEALTH MAINTENANCE: Last 2 paps:  07/06/21 neg HR HPV neg, 01/13/18 neg HPV neg History of abnormal Pap or positive HPV:  no Mammogram:   06/21/23 Breast Density Cat B, BIRADS Cat 2 benign  Colonoscopy:  01/03/23 - Dr. Vinnie Greet at [facility].  Bone Density: NA  Immunization History  Administered Date(s) Administered   PFIZER(Purple Top)SARS-COV-2 Vaccination 11/07/2019, 11/28/2019   Pfizer Covid-19 Vaccine Bivalent Booster 65yrs & up 07/16/2021      reports that she has never smoked. She has never used smokeless tobacco. She reports that she does not currently use alcohol. She reports  that she does not use drugs.  Past Medical History:  Diagnosis Date   Allergy    Anxiety    Arthritis    Colon polyp    Depression    Endometriosis    Fibroid    GERD (gastroesophageal reflux disease)    Microscopic hematuria    Thyroid  disease     Past Surgical History:  Procedure Laterality Date   ANTERIOR AND POSTERIOR REPAIR  04/15/2019   pelvic floor   APPENDECTOMY     BREAST SURGERY     Left-Due to papillomas   CESAREAN SECTION     COLONOSCOPY     ENDOMETRIAL BIOPSY  04/27/2020   benign   endomitriosis  1995   HYSTEROSCOPY WITH D & C  05/31/2020   benign   OOPHORECTOMY Right 1996   SALPINGECTOMY Bilateral 1996    Current Outpatient Medications  Medication Sig Dispense Refill   B Complex Vitamins (VITAMIN B COMPLEX PO) Take by mouth.     CALCIUM PO Take by mouth.     cetirizine  (ZYRTEC ) 10 MG tablet Take 1 tablet (  10 mg total) by mouth daily. 30 tablet 11   Cholecalciferol (VITAMIN D -3 PO) Take by mouth.     ELDERBERRY PO Take by mouth.     fluticasone  (FLONASE ) 50 MCG/ACT nasal spray Place 2 sprays into both nostrils daily.     IBUPROFEN PO Take by mouth.     VITAMIN E PO Take by mouth.     No current facility-administered medications for this visit.    ALLERGIES: Sulfa antibiotics  Family History  Problem Relation Age of Onset   Hyperlipidemia Mother    Hypertension Mother    Diabetes Father    Prostate cancer Father    Arthritis Sister    Rheum arthritis Sister    Heart disease Brother    Heart attack Brother    Cancer Maternal Grandmother        breast   Colon cancer Neg Hx    Esophageal cancer Neg Hx    Rectal cancer Neg Hx    Stomach cancer Neg Hx    Colon polyps Neg Hx     Review of Systems  All other systems reviewed and are negative.   PHYSICAL EXAM:  BP 122/80 (BP Location: Left Arm, Patient Position: Sitting, Cuff Size: Normal)   Pulse 86   Ht 5' 4.25" (1.632 m)   Wt 164 lb (74.4 kg)   LMP 10/26/2015 (Within Months)   SpO2  98%   BMI 27.93 kg/m     General appearance: alert, cooperative and appears stated age Head: normocephalic, without obvious abnormality, atraumatic Neck: no adenopathy, supple, symmetrical, trachea midline and thyroid  normal to inspection and palpation Lungs: clear to auscultation bilaterally Breasts: normal appearance, no masses or tenderness, No nipple retraction or dimpling, No nipple discharge or bleeding, No axillary adenopathy Heart: regular rate and rhythm Abdomen: soft, non-tender; no masses, no organomegaly Extremities: extremities normal, atraumatic, no cyanosis or edema Skin: skin color, texture, turgor normal. No rashes or lesions Lymph nodes: cervical, supraclavicular, and axillary nodes normal. Neurologic: grossly normal  Pelvic: External genitalia:  no lesions              No abnormal inguinal nodes palpated.              Urethra:  normal appearing urethra with no masses, tenderness or lesions              Bartholins and Skenes: normal                 Vagina: normal appearing vagina with normal color and discharge, no lesions.  First degree rectocele.  Good apical and anterior vaginal support.               Cervix: no lesions              Pap taken:  no Bimanual Exam:  Uterus:  normal size, contour, position, consistency, mobility, non-tender              Adnexa: no mass, fullness, tenderness              Rectal exam: Yes.  .  Confirms.              Anus:  normal sphincter tone, no lesions  Chaperone was present for exam:  Cottie Diss, CMA  ASSESSMENT: Well woman visit with gynecologic exam. Status post right oophorectomy and bilateral salpingectomy. Status post anterior and posterior colporrhaphy.  First degree rectocele today. Chronic constipation.  Urinary urgency.  Chronic right breast pain.  No  mass palpable. Hx breast cyst.  Hx fibroids. PHQ-9: 1  PLAN: Mammogram screening discussed.  Dx bilateral mammogram and left breast US  is due in July.  She will call to  schedule this as she does have this appointment yet.  Phone number given.  Self breast awareness reviewed. Pap and HRV collected:  no.  Due in 2027. Guidelines for Calcium, Vitamin D , regular exercise program including cardiovascular and weight bearing exercise. Medication refills:  NA Referral to Marsha Skeen.   She will follow up with her GI regarding her constipation.  Follow up:  yearly and prn.

## 2024-01-02 ENCOUNTER — Encounter: Payer: Self-pay | Admitting: Obstetrics and Gynecology

## 2024-01-02 ENCOUNTER — Ambulatory Visit (INDEPENDENT_AMBULATORY_CARE_PROVIDER_SITE_OTHER): Admitting: Obstetrics and Gynecology

## 2024-01-02 VITALS — BP 122/80 | HR 86 | Ht 64.25 in | Wt 164.0 lb

## 2024-01-02 DIAGNOSIS — R3915 Urgency of urination: Secondary | ICD-10-CM | POA: Diagnosis not present

## 2024-01-02 DIAGNOSIS — K5909 Other constipation: Secondary | ICD-10-CM

## 2024-01-02 DIAGNOSIS — N816 Rectocele: Secondary | ICD-10-CM

## 2024-01-02 DIAGNOSIS — Z1331 Encounter for screening for depression: Secondary | ICD-10-CM

## 2024-01-02 DIAGNOSIS — Z01419 Encounter for gynecological examination (general) (routine) without abnormal findings: Secondary | ICD-10-CM | POA: Diagnosis not present

## 2024-01-03 ENCOUNTER — Telehealth: Payer: Self-pay | Admitting: Obstetrics and Gynecology

## 2024-01-03 DIAGNOSIS — R3915 Urgency of urination: Secondary | ICD-10-CM

## 2024-01-03 DIAGNOSIS — N816 Rectocele: Secondary | ICD-10-CM

## 2024-01-03 DIAGNOSIS — K5909 Other constipation: Secondary | ICD-10-CM

## 2024-01-03 NOTE — Telephone Encounter (Signed)
 Please assist in making referral to Davy Estimable for pelvic floor therapy at [facility].  Patient would like to see this provider specifically.  She has a rectocele, urinary urgency, and chronic constipation.  Patient is also due to bilateral dx mammogram and left breast US  in July at the [facility].  Upon chart review, I see that she is due for bilateral dx mammogram and the left breast US . I gave her their phone number, but I think it would be best for our office to facilitate.  I do not see future orders for this.

## 2024-01-03 NOTE — Patient Instructions (Signed)

## 2024-01-03 NOTE — Telephone Encounter (Unsigned)
 LDVM with therapy at [facility] to inquire if Amy Burton is accepting any new/returning patients.   Ref order pend.   Msg received in response from PT office stating that Amy Burton is accepting new pt's but looking at August for first available.   Ok to move forward with referral?

## 2024-01-06 ENCOUNTER — Other Ambulatory Visit: Payer: Self-pay | Admitting: Family Medicine

## 2024-01-06 DIAGNOSIS — N6320 Unspecified lump in the left breast, unspecified quadrant: Secondary | ICD-10-CM

## 2024-01-06 DIAGNOSIS — Z419 Encounter for procedure for purposes other than remedying health state, unspecified: Secondary | ICD-10-CM | POA: Diagnosis not present

## 2024-01-06 NOTE — Telephone Encounter (Signed)
 Spoke w/ the pt and she is ok with waiting since she doesn't feel it is urgent. However, is also open to seeing another provider (first available) to see about the differences in plans.   Referral sent. Encounter closed.

## 2024-01-06 NOTE — Telephone Encounter (Signed)
 Please check with patient to see what her preference is for physical therapy.

## 2024-02-06 DIAGNOSIS — Z419 Encounter for procedure for purposes other than remedying health state, unspecified: Secondary | ICD-10-CM | POA: Diagnosis not present

## 2024-03-07 DIAGNOSIS — Z419 Encounter for procedure for purposes other than remedying health state, unspecified: Secondary | ICD-10-CM | POA: Diagnosis not present

## 2024-03-19 ENCOUNTER — Ambulatory Visit
Admission: RE | Admit: 2024-03-19 | Discharge: 2024-03-19 | Disposition: A | Discharge status: Home / Self Care | Source: Ambulatory Visit | Attending: Family Medicine | Admitting: Family Medicine

## 2024-03-19 DIAGNOSIS — N6320 Unspecified lump in the left breast, unspecified quadrant: Secondary | ICD-10-CM

## 2024-03-19 DIAGNOSIS — N6002 Solitary cyst of left breast: Secondary | ICD-10-CM | POA: Diagnosis not present

## 2024-03-20 ENCOUNTER — Ambulatory Visit: Admitting: Physical Therapy

## 2024-04-02 IMAGING — MG MM DIGITAL SCREENING BILAT W/ TOMO AND CAD
8 series · 8 of 24 positions shown · non-contrast
Comparison: None available.

CLINICAL DATA: Screening.

EXAM:
DIGITAL SCREENING BILATERAL MAMMOGRAM WITH TOMOSYNTHESIS AND CAD
TECHNIQUE: Bilateral screening digital craniocaudal and mediolateral oblique
mammograms were obtained. Bilateral screening digital breast
tomosynthesis was performed. The images were evaluated with
computer-aided detection.

[R MLO synth-2D]
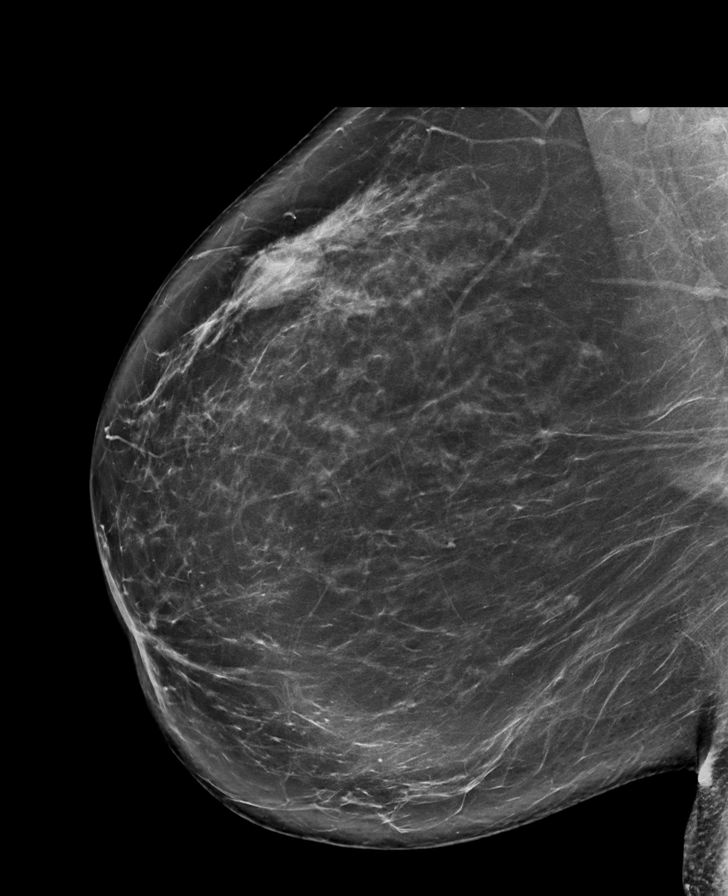

[L MLO synth-2D]
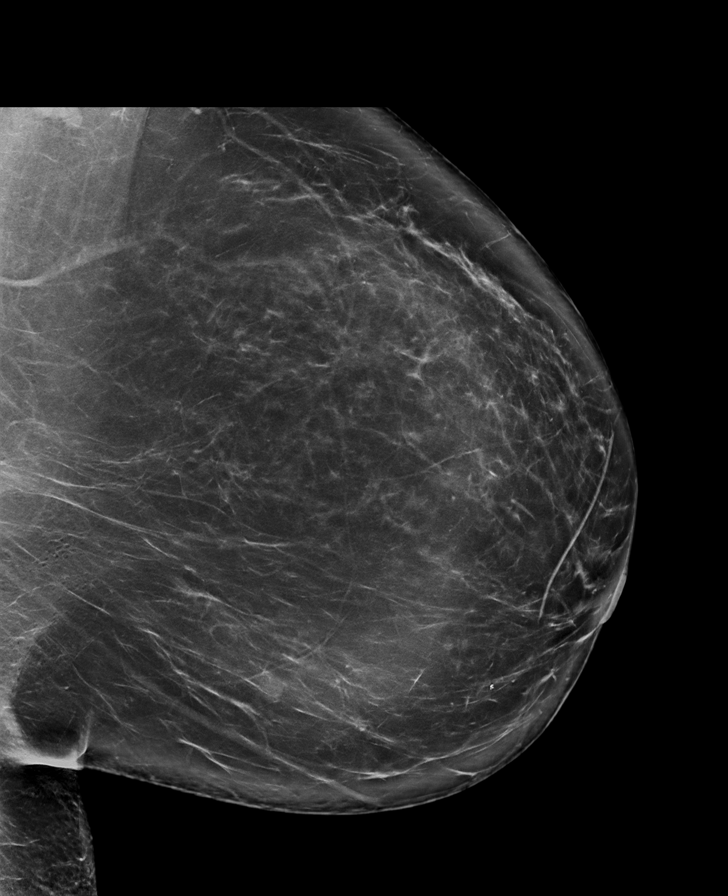

[L CC synth-2D]
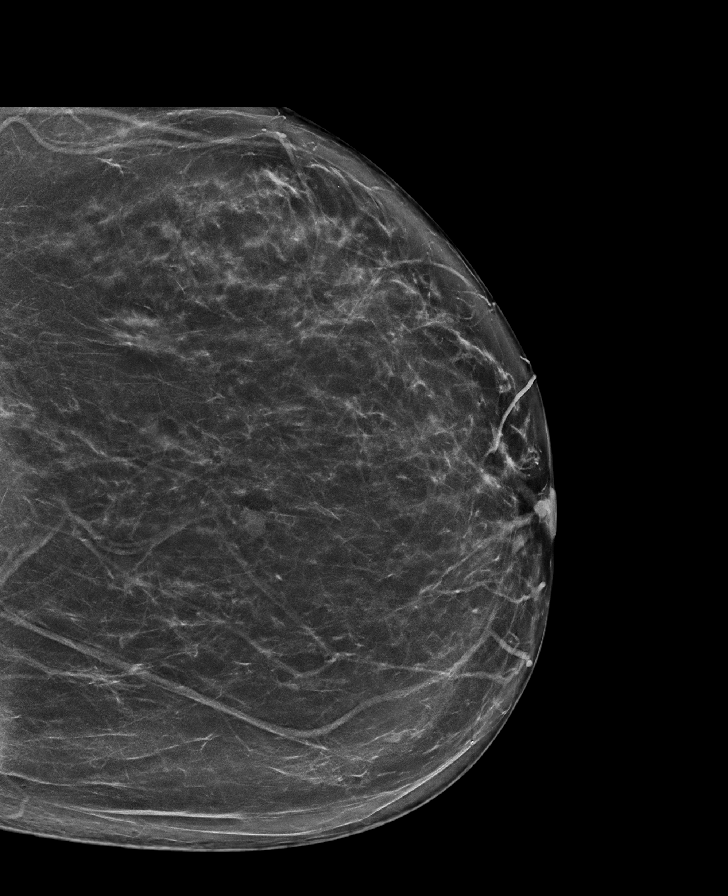

[R CC synth-2D]
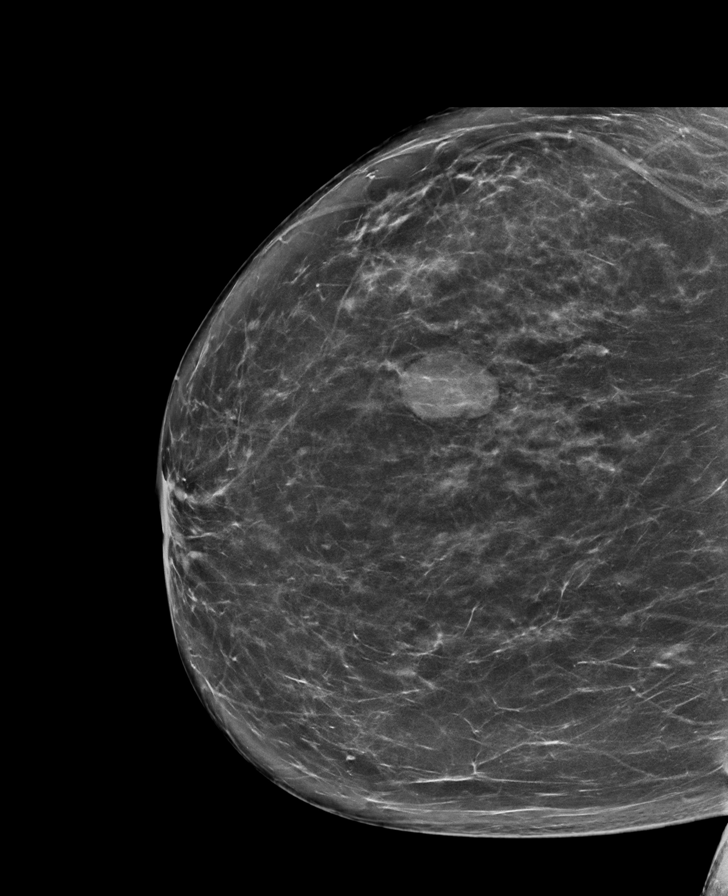

[R MLO tomo · tomo slice 49/97.0]
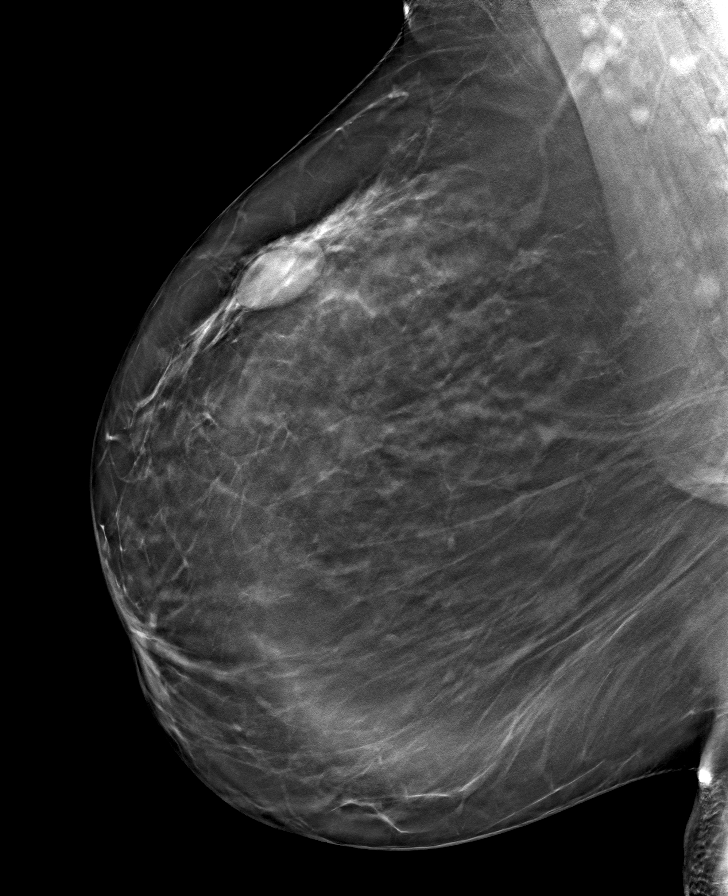

[L MLO tomo · tomo slice 51/101.0]
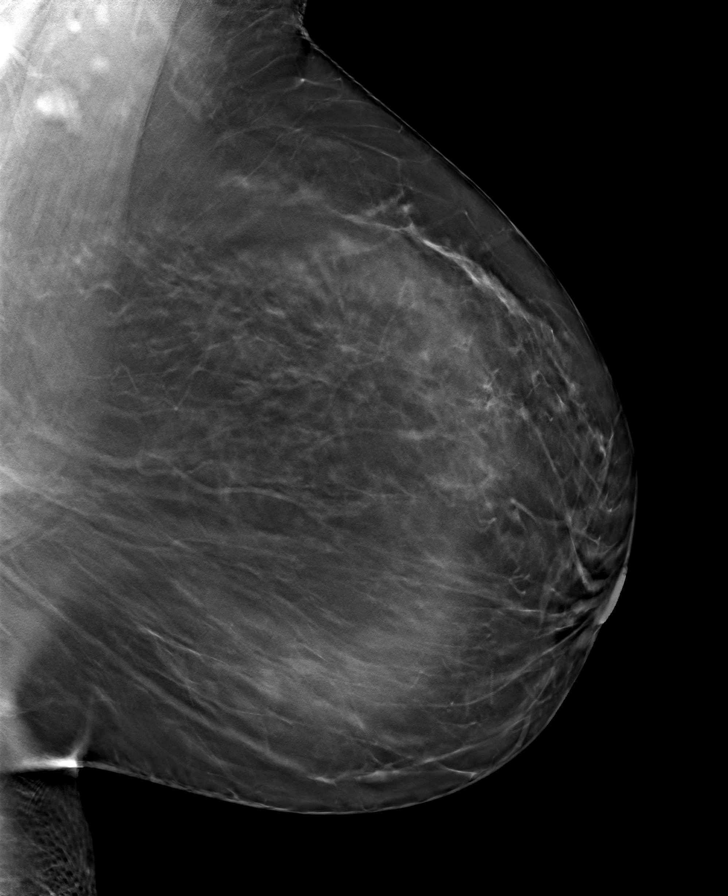

[R CC tomo · tomo slice 46/91.0]
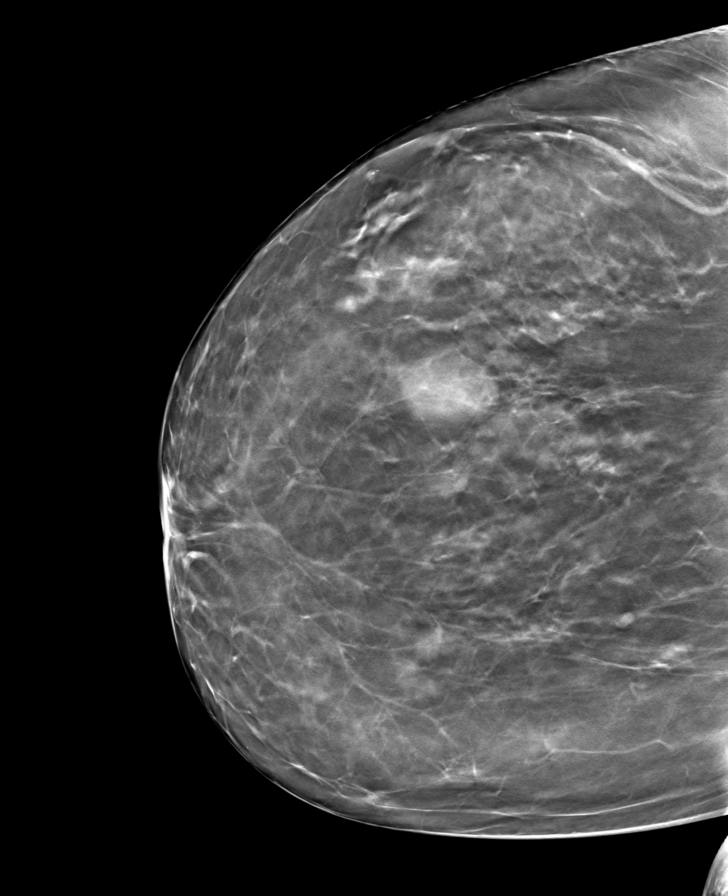

[L CC tomo · tomo slice 44/87.0]
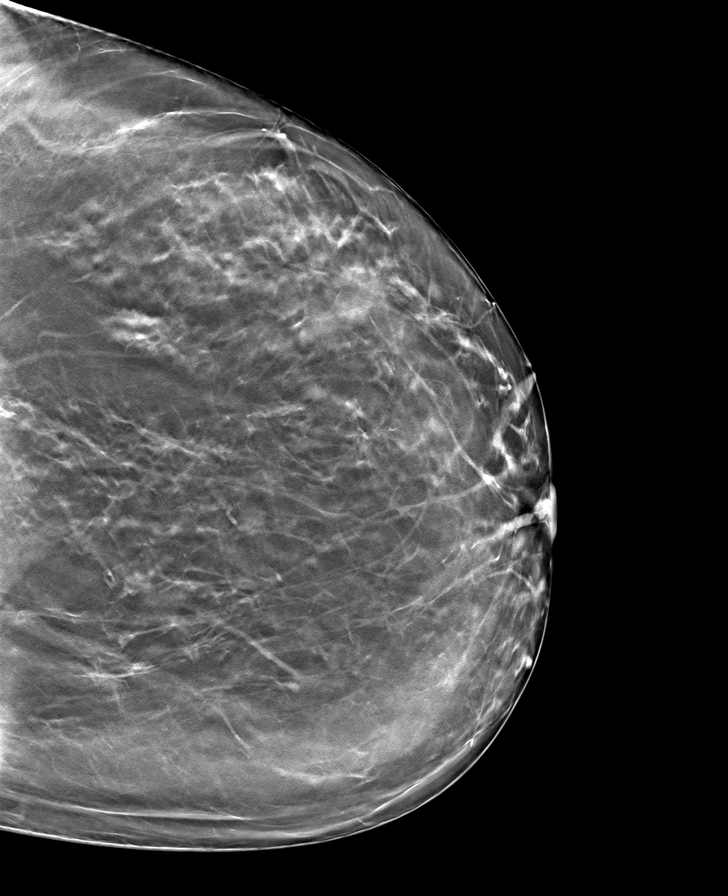

[8 of 24 positions shown; findings below may reference images not displayed]

ACR Breast Density Category b: There are scattered areas of
fibroglandular density.
FINDINGS: In the right breast , a possible mass requires further evaluation.
This mass is seen within the upper RIGHT breast.

In the left breast , a possible mass requires further evaluation.
This mass is seen within the lower LEFT breast.
IMPRESSION: Further evaluation is suggested for possible mass in the right
breast.

Further evaluation is suggested for possible mass in the left
breast.

RECOMMENDATION:
Diagnostic ultrasound of both breasts. (Code:[id number])

The patient will be contacted regarding the findings, and additional
imaging will be scheduled.

BI-RADS CATEGORY  0: Incomplete. Need additional imaging evaluation
and/or prior mammograms for comparison.

## 2024-05-04 DIAGNOSIS — D1801 Hemangioma of skin and subcutaneous tissue: Secondary | ICD-10-CM | POA: Diagnosis not present

## 2024-05-04 DIAGNOSIS — L821 Other seborrheic keratosis: Secondary | ICD-10-CM | POA: Diagnosis not present

## 2024-05-04 DIAGNOSIS — D2262 Melanocytic nevi of left upper limb, including shoulder: Secondary | ICD-10-CM | POA: Diagnosis not present

## 2024-05-04 DIAGNOSIS — L814 Other melanin hyperpigmentation: Secondary | ICD-10-CM | POA: Diagnosis not present

## 2025-01-04 ENCOUNTER — Ambulatory Visit: Admitting: Obstetrics and Gynecology
